# Patient Record
Sex: Male | Born: 1990 | Race: White | Hispanic: No | Marital: Married | Smoking: Former smoker
Health system: Southern US, Community
[De-identification: ages and names within clinical notes are randomized; demographics above are authoritative.]

## PROBLEM LIST (undated history)

## (undated) DIAGNOSIS — Z789 Other specified health status: Secondary | ICD-10-CM

## (undated) HISTORY — PX: WISDOM TOOTH EXTRACTION: SHX21

## (undated) HISTORY — DX: Other specified health status: Z78.9

## (undated) HISTORY — PX: TONSILLECTOMY: SUR1361

---

## 2013-03-22 ENCOUNTER — Emergency Department (INDEPENDENT_AMBULATORY_CARE_PROVIDER_SITE_OTHER): Payer: Managed Care, Other (non HMO)

## 2013-03-22 ENCOUNTER — Emergency Department
Admission: EM | Admit: 2013-03-22 | Discharge: 2013-03-22 | Disposition: A | Discharge status: Home / Self Care | Payer: Managed Care, Other (non HMO) | Source: Home / Self Care | Attending: Emergency Medicine | Admitting: Emergency Medicine

## 2013-03-22 ENCOUNTER — Encounter: Payer: Self-pay | Admitting: Emergency Medicine

## 2013-03-22 DIAGNOSIS — IMO0002 Reserved for concepts with insufficient information to code with codable children: Secondary | ICD-10-CM

## 2013-03-22 DIAGNOSIS — M25469 Effusion, unspecified knee: Secondary | ICD-10-CM

## 2013-03-22 DIAGNOSIS — S86912A Strain of unspecified muscle(s) and tendon(s) at lower leg level, left leg, initial encounter: Secondary | ICD-10-CM

## 2013-03-22 DIAGNOSIS — M25569 Pain in unspecified knee: Secondary | ICD-10-CM

## 2013-03-22 MED ORDER — MELOXICAM 7.5 MG PO TABS: 7.5000 mg | ORAL_TABLET | Freq: Every day | ORAL | Status: DC

## 2013-03-22 MED ORDER — HYDROCODONE-ACETAMINOPHEN 5-325 MG PO TABS: 1.0000 | ORAL_TABLET | Freq: Three times a day (TID) | ORAL | Status: DC | PRN

## 2013-03-22 NOTE — ED Provider Notes (Signed)
CSN: [id number]     Arrival date & time 03/22/13  1020 History   First MD Initiated Contact with Patient 03/22/13 1021     Chief Complaint  Patient presents with  . Knee Pain   (Consider location/radiation/quality/duration/timing/severity/associated sxs/prior Treatment) HPI Roger Fox reports shifting his weight to his left knee 2 nights ago, when he was on his honeymoon in [place], when it "gave out and dislocated" laterally. He popped it back in place after 3 minutes. 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 He reports that this started about 10 years ago after an injury, or an MRI showed some type of medial collateral ligament injury, but no surgery was done. Has tried ibuprofen which helps the pain a little bit. Yesterday, pain 8/10. Currently the pain is 4/10 at rest. Exacerbated by pressing on it or trying to weight-bear.--He describes the pain as sharp.  Associated symptoms: Denies numbness.   History reviewed. No pertinent past medical history. Past Surgical History  Procedure Laterality Date  . Tonsillectomy    . Wisdom tooth extraction     Family History  Problem Relation Age of Onset  . Diabetes Other   . Stroke Other    History  Substance Use Topics  . Smoking status: Never Smoker   . Smokeless tobacco: Not on file  . Alcohol Use: Yes    Review of Systems  All other systems reviewed and are negative.    Allergies  Sulfur  Home Medications   Current Outpatient Rx  Name  Route  Sig  Dispense  Refill  . HYDROcodone-acetaminophen (NORCO/VICODIN) 5-325 MG per tablet   Oral   Take 1-2 tablets by mouth every 8 (eight) hours as needed for pain. Take with food.--May cause drowsiness   10 tablet   0   . meloxicam (MOBIC) 7.5 MG tablet   Oral   Take 1 tablet (7.5 mg total) by mouth daily. For pain. If needed, may increase to 2 tablets by mouth daily for pain.   30 tablet   0    BP 132/82  Pulse 88  Resp 14  Ht 5\' 8"   (1.727 m)  Wt 185 lb (83.915 kg)  BMI 28.14 kg/m2  SpO2 96% Physical Exam  Nursing note and vitals reviewed. Constitutional: He is oriented to person, place, and time. He appears well-developed and well-nourished. No distress.  HENT:  Head: Normocephalic and atraumatic.  Eyes: Conjunctivae and EOM are normal. Pupils are equal, round, and reactive to light. No scleral icterus.  Neck: Normal range of motion.  Cardiovascular: Normal rate.   Pulmonary/Chest: Effort normal.  Abdominal: He exhibits no distension.  Musculoskeletal:       Left knee: He exhibits decreased range of motion, swelling, abnormal patellar mobility and bony tenderness. He exhibits no ecchymosis, no deformity and no laceration. Tenderness found. Medial joint line, lateral joint line, MCL and patellar tendon tenderness noted.  Exam difficult because of pain. No definite instability elicited.  Neurological: He is alert and oriented to person, place, and time. No sensory deficit.  Skin: Skin is warm.  Psychiatric: He has a normal mood and affect.    ED Course  Procedures (including critical care time) Labs Review Labs Reviewed - No data to display Imaging Review Dg Knee Complete 4 Views Left  03/22/2013   CLINICAL DATA:  Patellar dislocation on Saturday. Pain and swelling. History of recurrent dislocations.  EXAM: LEFT KNEE - COMPLETE 4+ VIEW  COMPARISON:  None.  FINDINGS: No acute fracture or dislocation. Suspect a small suprapatellar joint effusion.  IMPRESSION: Probable small suprapatellar joint effusion.   Electronically Signed   By: Jeronimo Greaves M.D.   On: 03/22/2013 11:36    EKG Interpretation     Ventricular Rate:    PR Interval:    QRS Duration:   QT Interval:    QTC Calculation:   R Axis:     Text Interpretation:              MDM   1. Knee strain, left, initial encounter    Reviewed x-rays. No acute fracture or dislocation. Possible small suprapatellar joint effusion. By history and exam,  it's likely that he had subluxation of patella laterally that is now back in place. Discussed this with patient and wife. Management options discussed at length. Questions invited and answered. Ice, elevate for first 48 hours after injury. Left knee splint/immobilizer provided. Use crutches which he already has. Meloxicam for moderate pain. Vicodin rx , acute prescription for severe acute pain. Precautions discussed. Advised followup with orthopedist within 2 days for further evaluation and treatment.--Patient's wife questioned whether or not we can order MRI knee at this time, and I advised that it would be best for orthopedic specialist to evaluate patient and then decide whether or not to order MRI.--We offered referral to orthopedist, but patient and wife declined her in making the orthopedic referral. Names and phone numbers of orthopedists given, and they'll call to see orthopedist within 2 days. Precautions discussed. Red flags discussed. Questions invited and answered. Patient voiced understanding and agreement.     Lajean Manes, MD 03/22/13 1210

## 2013-03-22 NOTE — ED Notes (Signed)
Armen reports shifting his weight to his left knee last night when it gave out and dislocated. He has a history of luxating patella.

## 2013-04-07 ENCOUNTER — Ambulatory Visit (INDEPENDENT_AMBULATORY_CARE_PROVIDER_SITE_OTHER): Payer: Managed Care, Other (non HMO) | Admitting: Sports Medicine

## 2013-04-07 ENCOUNTER — Encounter: Payer: Self-pay | Admitting: Sports Medicine

## 2013-04-07 VITALS — BP 139/84 | HR 91 | Wt 197.0 lb

## 2013-04-07 DIAGNOSIS — S83006A Unspecified dislocation of unspecified patella, initial encounter: Secondary | ICD-10-CM | POA: Insufficient documentation

## 2013-04-07 DIAGNOSIS — S83005A Unspecified dislocation of left patella, initial encounter: Secondary | ICD-10-CM

## 2013-04-07 NOTE — Progress Notes (Signed)
   Subjective:    I'm seeing this patient as a consultation for:  Dr. 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 More recently he was on his honeymoon, and dislocated his patella, it took in one hour to get back into place. He was seen by urgent care and referred to me for further evaluation and definitive treatment. Pain is localized over the medial aspect of the patella. Symptoms are moderate, persistent.  Past medical history, Surgical history, Family history not pertinant except as noted below, Social history, Allergies, and medications have been entered into the medical record, reviewed, and no changes needed.   Review of Systems: No headache, visual changes, nausea, vomiting, diarrhea, constipation, dizziness, abdominal pain, skin rash, fevers, chills, night sweats, weight loss, swollen lymph nodes, body aches, joint swelling, muscle aches, chest pain, shortness of breath, mood changes, visual or auditory hallucinations.   Objective:   General: Well Developed, well nourished, and in no acute distress.  Neuro/Psych: Alert and oriented x3, extra-ocular muscles intact, able to move all 4 extremities, sensation grossly intact. Skin: Warm and dry, no rashes noted.  Respiratory: Not using accessory muscles, speaking in full sentences, trachea midline.  Cardiovascular: Pulses palpable, no extremity edema. Abdomen: Does not appear distended. Left Knee: Normal to inspection with no erythema or effusion or obvious bony abnormalities. Tender to palpation over the medial patellar retinaculum. ROM full in flexion and extension and lower leg rotation. Ligaments with solid consistent endpoints including ACL, PCL, LCL, MCL. Negative Mcmurray's, Apley's, and Thessalonian tests. Positive patellar apprehension sign. Patellar and quadriceps tendons  unremarkable. Hamstring and quadriceps strength is normal.   X-rays were negative with the exception of trace effusion.  Impression and Recommendations:   This case required medical decision making of moderate complexity.

## 2013-04-07 NOTE — Assessment & Plan Note (Signed)
Recurrent, exam is fairly benign with only a positive patellar apprehension sign. At this point with his poor vastus medialis definition I do think we need to pursue more aggressive physical therapy. I would also like him to have a patellar stabilizing brace. I like to see him back monthly for approximately 2-3 months before considering having him see the surgeon for consideration of lateral release and medial patellar plication. Certainly in the future he could be a candidate for the Fulkerson slide if all of the above fail. Continue NSAIDs as needed.

## 2013-04-13 ENCOUNTER — Ambulatory Visit (INDEPENDENT_AMBULATORY_CARE_PROVIDER_SITE_OTHER): Payer: Managed Care, Other (non HMO) | Admitting: Physical Therapy

## 2013-04-13 DIAGNOSIS — R609 Edema, unspecified: Secondary | ICD-10-CM

## 2013-04-13 DIAGNOSIS — M6281 Muscle weakness (generalized): Secondary | ICD-10-CM

## 2013-04-13 DIAGNOSIS — R269 Unspecified abnormalities of gait and mobility: Secondary | ICD-10-CM

## 2013-04-13 DIAGNOSIS — S83006A Unspecified dislocation of unspecified patella, initial encounter: Secondary | ICD-10-CM

## 2013-04-20 ENCOUNTER — Encounter: Payer: Managed Care, Other (non HMO) | Admitting: Physical Therapy

## 2013-04-20 DIAGNOSIS — M6281 Muscle weakness (generalized): Secondary | ICD-10-CM

## 2013-04-20 DIAGNOSIS — R609 Edema, unspecified: Secondary | ICD-10-CM

## 2013-04-20 DIAGNOSIS — M25669 Stiffness of unspecified knee, not elsewhere classified: Secondary | ICD-10-CM

## 2013-04-20 DIAGNOSIS — S83006A Unspecified dislocation of unspecified patella, initial encounter: Secondary | ICD-10-CM

## 2013-04-20 DIAGNOSIS — R269 Unspecified abnormalities of gait and mobility: Secondary | ICD-10-CM

## 2013-04-30 ENCOUNTER — Encounter: Payer: Managed Care, Other (non HMO) | Admitting: Physical Therapy

## 2013-04-30 DIAGNOSIS — M25669 Stiffness of unspecified knee, not elsewhere classified: Secondary | ICD-10-CM

## 2013-04-30 DIAGNOSIS — M6281 Muscle weakness (generalized): Secondary | ICD-10-CM

## 2013-04-30 DIAGNOSIS — R609 Edema, unspecified: Secondary | ICD-10-CM

## 2013-04-30 DIAGNOSIS — R269 Unspecified abnormalities of gait and mobility: Secondary | ICD-10-CM

## 2013-04-30 DIAGNOSIS — S83006A Unspecified dislocation of unspecified patella, initial encounter: Secondary | ICD-10-CM

## 2013-05-05 ENCOUNTER — Ambulatory Visit (INDEPENDENT_AMBULATORY_CARE_PROVIDER_SITE_OTHER): Payer: Managed Care, Other (non HMO) | Admitting: Sports Medicine

## 2013-05-05 ENCOUNTER — Encounter: Payer: Self-pay | Admitting: Sports Medicine

## 2013-05-05 VITALS — BP 135/81 | HR 78 | Wt 197.0 lb

## 2013-05-05 DIAGNOSIS — S83006A Unspecified dislocation of unspecified patella, initial encounter: Secondary | ICD-10-CM

## 2013-05-05 DIAGNOSIS — S83005A Unspecified dislocation of left patella, initial encounter: Secondary | ICD-10-CM

## 2013-05-05 NOTE — Assessment & Plan Note (Signed)
Strength and pain continues to improve after patellar dislocation. Continue aggressive vastus medialis rehabilitation as well as hip abductor rehabilitation. Continue on day per week and physical therapy, and daily exercises at home. Continue Mobic on an as needed basis, continue patellar stabilizing brace. Return to see me in 2 months. I do think he will proceed well, and will not need surgical intervention.

## 2013-05-05 NOTE — Patient Instructions (Signed)
Exercise prescription:  You should adjust the intensity of your exercise based on your heart rate. The [employer] sports medicine recommends keeping your heart rate between 70-80% of its maximum for 30 minutes, 3-5 times per week. Maximum heart rate = (220 - age). Multiply this number by 0.75 to get your goal heart rate. If lower, then increase the intensity of your exercise. If the number is higher, you may decrease the intensity of your exercise. 

## 2013-05-05 NOTE — Progress Notes (Signed)
  Subjective:    CC: Followup  HPI: Left patellar dislocation: Patient continues to improve and makes great strides with formal physical therapy and vastus medialis rehabilitation. He also is doing very well in the patellar stabilizing brace. Pain is significantly decreased, he is able to walk and go up and down stairs without pain.  Past medical history, Surgical history, Family history not pertinant except as noted below, Social history, Allergies, and medications have been entered into the medical record, reviewed, and no changes needed.   Review of Systems: No fevers, chills, night sweats, weight loss, chest pain, or shortness of breath.   Objective:    General: Well Developed, well nourished, and in no acute distress.  Neuro: Alert and oriented x3, extra-ocular muscles intact, sensation grossly intact.  HEENT: Normocephalic, atraumatic, pupils equal round reactive to light, neck supple, no masses, no lymphadenopathy, thyroid nonpalpable.  Skin: Warm and dry, no rashes. Cardiac: Regular rate and rhythm, no murmurs rubs or gallops, no lower extremity edema.  Respiratory: Clear to auscultation bilaterally. Not using accessory muscles, speaking in full sentences. Left Knee: Only minimally swollen, no areas of tenderness to palpation, he now has a negative patellar apprehension sign. ROM full in flexion and extension and lower leg rotation. Ligaments with solid consistent endpoints including ACL, PCL, LCL, MCL. Negative Mcmurray's, Apley's, and Thessalonian tests. Non painful patellar compression. Patellar glide without crepitus. Patellar and quadriceps tendons unremarkable. Hamstring and quadriceps strength is normal.   Impression and Recommendations:

## 2013-05-11 ENCOUNTER — Encounter (INDEPENDENT_AMBULATORY_CARE_PROVIDER_SITE_OTHER): Payer: Managed Care, Other (non HMO) | Admitting: Physical Therapy

## 2013-05-11 DIAGNOSIS — M6281 Muscle weakness (generalized): Secondary | ICD-10-CM

## 2013-05-11 DIAGNOSIS — S83006A Unspecified dislocation of unspecified patella, initial encounter: Secondary | ICD-10-CM

## 2013-05-11 DIAGNOSIS — R279 Unspecified lack of coordination: Secondary | ICD-10-CM

## 2013-05-11 DIAGNOSIS — R609 Edema, unspecified: Secondary | ICD-10-CM

## 2013-05-17 ENCOUNTER — Encounter (INDEPENDENT_AMBULATORY_CARE_PROVIDER_SITE_OTHER): Payer: Managed Care, Other (non HMO) | Admitting: Physical Therapy

## 2013-05-17 DIAGNOSIS — S83006A Unspecified dislocation of unspecified patella, initial encounter: Secondary | ICD-10-CM

## 2013-05-17 DIAGNOSIS — R609 Edema, unspecified: Secondary | ICD-10-CM

## 2013-05-17 DIAGNOSIS — M25669 Stiffness of unspecified knee, not elsewhere classified: Secondary | ICD-10-CM

## 2013-05-17 DIAGNOSIS — M6281 Muscle weakness (generalized): Secondary | ICD-10-CM

## 2013-05-17 DIAGNOSIS — R279 Unspecified lack of coordination: Secondary | ICD-10-CM

## 2013-05-24 ENCOUNTER — Encounter (INDEPENDENT_AMBULATORY_CARE_PROVIDER_SITE_OTHER): Payer: Managed Care, Other (non HMO) | Admitting: Physical Therapy

## 2013-05-24 ENCOUNTER — Encounter (INDEPENDENT_AMBULATORY_CARE_PROVIDER_SITE_OTHER): Payer: Self-pay

## 2013-05-24 DIAGNOSIS — M25669 Stiffness of unspecified knee, not elsewhere classified: Secondary | ICD-10-CM

## 2013-05-24 DIAGNOSIS — R609 Edema, unspecified: Secondary | ICD-10-CM

## 2013-05-24 DIAGNOSIS — M6281 Muscle weakness (generalized): Secondary | ICD-10-CM

## 2013-05-24 DIAGNOSIS — R269 Unspecified abnormalities of gait and mobility: Secondary | ICD-10-CM

## 2013-05-24 DIAGNOSIS — S83006A Unspecified dislocation of unspecified patella, initial encounter: Secondary | ICD-10-CM

## 2014-07-26 ENCOUNTER — Encounter: Payer: Self-pay | Admitting: *Deleted

## 2014-07-26 ENCOUNTER — Emergency Department (INDEPENDENT_AMBULATORY_CARE_PROVIDER_SITE_OTHER)
Admission: EM | Admit: 2014-07-26 | Discharge: 2014-07-26 | Disposition: A | Discharge status: Home / Self Care | Payer: BLUE CROSS/BLUE SHIELD | Source: Home / Self Care | Attending: Emergency Medicine | Admitting: Emergency Medicine

## 2014-07-26 DIAGNOSIS — J029 Acute pharyngitis, unspecified: Secondary | ICD-10-CM

## 2014-07-26 DIAGNOSIS — B349 Viral infection, unspecified: Secondary | ICD-10-CM

## 2014-07-26 LAB — POCT RAPID STREP A (OFFICE): Rapid Strep A Screen: NEGATIVE

## 2014-07-26 MED ORDER — HYDROCOD POLST-CHLORPHEN POLST 10-8 MG/5ML PO LQCR: 5.0000 mL | Freq: Two times a day (BID) | ORAL | Status: DC

## 2014-07-26 MED ORDER — IBUPROFEN 800 MG PO TABS: 800.0000 mg | ORAL_TABLET | Freq: Three times a day (TID) | ORAL | Status: DC

## 2014-07-26 NOTE — Discharge Instructions (Signed)
Cough, Adult ° A cough is a reflex that helps clear your throat and airways. It can help heal the body or may be a reaction to an irritated airway. A cough may only last 2 or 3 weeks (acute) or may last more than 8 weeks (chronic).  °CAUSES °Acute cough: °· Viral or bacterial infections. °Chronic cough: °· Infections. °· Allergies. °· Asthma. °· Post-nasal drip. °· Smoking. °· Heartburn or acid reflux. °· Some medicines. °· Chronic lung problems (COPD). °· Cancer. °SYMPTOMS  °· Cough. °· Fever. °· Chest pain. °· Increased breathing rate. °· High-pitched whistling sound when breathing (wheezing). °· Colored mucus that you cough up (sputum). °TREATMENT  °· A bacterial cough may be treated with antibiotic medicine. °· A viral cough must run its course and will not respond to antibiotics. °· Your caregiver may recommend other treatments if you have a chronic cough. °HOME CARE INSTRUCTIONS  °· Only take over-the-counter or prescription medicines for pain, discomfort, or fever as directed by your caregiver. Use cough suppressants only as directed by your caregiver. °· Use a cold steam vaporizer or humidifier in your bedroom or home to help loosen secretions. °· Sleep in a semi-upright position if your cough is worse at night. °· Rest as needed. °· Stop smoking if you smoke. °SEEK IMMEDIATE MEDICAL CARE IF:  °· You have pus in your sputum. °· Your cough starts to worsen. °· You cannot control your cough with suppressants and are losing sleep. °· You begin coughing up blood. °· You have difficulty breathing. °· You develop pain which is getting worse or is uncontrolled with medicine. °· You have a fever. °MAKE SURE YOU:  °· Understand these instructions. °· Will watch your condition. °· Will get help right away if you are not doing well or get worse. °Document Released: 11/09/2010 Document Revised: 08/05/2011 Document Reviewed: 11/09/2010 °[employer]® Patient Information ©2015 [employer]. This information is not intended  to replace advice given to you by your health care provider. Make sure you discuss any questions you have with your health care provider. °Viral Infections °A viral infection can be caused by different types of viruses. Most viral infections are not serious and resolve on their own. However, some infections may cause severe symptoms and may lead to further complications. °SYMPTOMS °Viruses can frequently cause: °· Minor sore throat. °· Aches and pains. °· Headaches. °· Runny nose. °· Different types of rashes. °· Watery eyes. °· Tiredness. °· Cough. °· Loss of appetite. °· Gastrointestinal infections, resulting in nausea, vomiting, and diarrhea. °These symptoms do not respond to antibiotics because the infection is not caused by bacteria. However, you might catch a bacterial infection following the viral infection. This is sometimes called a "superinfection." Symptoms of such a bacterial infection may include: °· Worsening sore throat with pus and difficulty swallowing. °· Swollen neck glands. °· Chills and a high or persistent fever. °· Severe headache. °· Tenderness over the sinuses. °· Persistent overall ill feeling (malaise), muscle aches, and tiredness (fatigue). °· Persistent cough. °· Yellow, green, or brown mucus production with coughing. °HOME CARE INSTRUCTIONS  °· Only take over-the-counter or prescription medicines for pain, discomfort, diarrhea, or fever as directed by your caregiver. °· Drink enough water and fluids to keep your urine clear or pale yellow. Sports drinks can provide valuable electrolytes, sugars, and hydration. °· Get plenty of rest and maintain proper nutrition. Soups and broths with crackers or rice are fine. °SEEK IMMEDIATE MEDICAL CARE IF:  °· You have severe headaches,   shortness of breath, chest pain, neck pain, or an unusual rash. °· You have uncontrolled vomiting, diarrhea, or you are unable to keep down fluids. °· You or your child has an oral temperature above 102° F (38.9° C),  not controlled by medicine. °· Your baby is older than 3 months with a rectal temperature of 102° F (38.9° C) or higher. °· Your baby is 3 months old or younger with a rectal temperature of 100.4° F (38° C) or higher. °MAKE SURE YOU:  °· Understand these instructions. °· Will watch your condition. °· Will get help right away if you are not doing well or get worse. °Document Released: 02/20/2005 Document Revised: 08/05/2011 Document Reviewed: 09/17/2010 °ExitCare® Patient Information ©2015 [employer]. This information is not intended to replace advice given to you by your health care provider. Make sure you discuss any questions you have with your health care provider. ° °

## 2014-07-26 NOTE — ED Provider Notes (Signed)
CSN: [id number]     Arrival date & time 07/26/14  1227 History   First MD Initiated Contact with Patient 07/26/14 1335     Chief Complaint  Patient presents with  . Sore Throat  . Cough   (Consider location/radiation/quality/duration/timing/severity/associated sxs/prior Treatment) Patient is a 24 y.o. male presenting with cough. The history is provided by the patient. No language interpreter was used.  Cough Cough characteristics:  Productive Sputum characteristics:  Nondescript Severity:  Moderate Onset quality:  Gradual Duration:  4 days Timing:  Constant Progression:  Worsening Chronicity:  New Smoker: no   Context: upper respiratory infection   Relieved by:  Nothing Worsened by:  Nothing tried Ineffective treatments:  None tried Associated symptoms: fever, headaches, rhinorrhea and sore throat     History reviewed. No pertinent past medical history. Past Surgical History  Procedure Laterality Date  . Tonsillectomy    . Wisdom tooth extraction     Family History  Problem Relation Age of Onset  . Diabetes Other   . Stroke Other    History  Substance Use Topics  . Smoking status: Former Games developermoker  . Smokeless tobacco: Not on file  . Alcohol Use: Yes    Review of Systems  Constitutional: Positive for fever.  HENT: Positive for rhinorrhea and sore throat.   Respiratory: Positive for cough.   Neurological: Positive for headaches.  All other systems reviewed and are negative.   Allergies  Sulfur  Home Medications   Prior to Admission medications   Not on File   BP 132/83 mmHg  Pulse 64  Temp(Src) 98.2 F (36.8 C) (Oral)  Resp 16  Ht 5\' 8"  (1.727 m)  Wt 200 lb (90.719 kg)  BMI 30.42 kg/m2  SpO2 99% Physical Exam  Constitutional: He is oriented to person, place, and time. He appears well-developed and well-nourished.  HENT:  Head: Normocephalic and atraumatic.  Eyes: Conjunctivae and EOM are normal. Pupils are equal, round, and reactive to light.   Neck: Normal range of motion.  Cardiovascular: Normal rate and normal heart sounds.   Pulmonary/Chest: Effort normal.  Abdominal: Soft. He exhibits no distension.  Musculoskeletal: Normal range of motion.  Neurological: He is alert and oriented to person, place, and time.  Skin: Skin is warm.  Psychiatric: He has a normal mood and affect.  Nursing note and vitals reviewed.   ED Course  Procedures (including critical care time) Labs Review Labs Reviewed  POCT RAPID STREP A (OFFICE)    Imaging Review No results found. Strep negative   MDM I suspect viral illness   1. Viral illness    Ibuprofen tussionex Return if any problems.    Lonia SkinnerLeslie K Mountain DaleSofia, PA-C 07/26/14 [phone]

## 2014-07-26 NOTE — ED Notes (Signed)
Pt c/o cough, nasal congestion, and sore throat x 4 days. No fever.

## 2017-11-18 DIAGNOSIS — Z91018 Allergy to other foods: Secondary | ICD-10-CM | POA: Diagnosis not present

## 2017-11-18 DIAGNOSIS — J3089 Other allergic rhinitis: Secondary | ICD-10-CM | POA: Diagnosis not present

## 2017-11-18 DIAGNOSIS — J301 Allergic rhinitis due to pollen: Secondary | ICD-10-CM | POA: Diagnosis not present

## 2017-11-19 DIAGNOSIS — Z91018 Allergy to other foods: Secondary | ICD-10-CM | POA: Diagnosis not present

## 2018-03-04 DIAGNOSIS — R05 Cough: Secondary | ICD-10-CM | POA: Diagnosis not present

## 2018-03-04 DIAGNOSIS — J301 Allergic rhinitis due to pollen: Secondary | ICD-10-CM | POA: Diagnosis not present

## 2018-03-04 DIAGNOSIS — Z91018 Allergy to other foods: Secondary | ICD-10-CM | POA: Diagnosis not present

## 2018-03-04 DIAGNOSIS — J3089 Other allergic rhinitis: Secondary | ICD-10-CM | POA: Diagnosis not present

## 2018-04-03 DIAGNOSIS — Z23 Encounter for immunization: Secondary | ICD-10-CM | POA: Diagnosis not present

## 2018-07-08 DIAGNOSIS — K600 Acute anal fissure: Secondary | ICD-10-CM | POA: Diagnosis not present

## 2018-10-16 DIAGNOSIS — B356 Tinea cruris: Secondary | ICD-10-CM | POA: Diagnosis not present

## 2018-11-12 DIAGNOSIS — B356 Tinea cruris: Secondary | ICD-10-CM | POA: Diagnosis not present

## 2018-11-12 DIAGNOSIS — Z0189 Encounter for other specified special examinations: Secondary | ICD-10-CM | POA: Diagnosis not present

## 2018-11-24 ENCOUNTER — Emergency Department (INDEPENDENT_AMBULATORY_CARE_PROVIDER_SITE_OTHER): Payer: BC Managed Care – PPO

## 2018-11-24 ENCOUNTER — Other Ambulatory Visit: Payer: Self-pay

## 2018-11-24 ENCOUNTER — Emergency Department (INDEPENDENT_AMBULATORY_CARE_PROVIDER_SITE_OTHER)
Admission: EM | Admit: 2018-11-24 | Discharge: 2018-11-24 | Disposition: A | Discharge status: Home / Self Care | Payer: BC Managed Care – PPO | Source: Home / Self Care | Attending: Family Medicine | Admitting: Family Medicine

## 2018-11-24 DIAGNOSIS — M25561 Pain in right knee: Secondary | ICD-10-CM | POA: Diagnosis not present

## 2018-11-24 DIAGNOSIS — B356 Tinea cruris: Secondary | ICD-10-CM

## 2018-11-24 DIAGNOSIS — S83001A Unspecified subluxation of right patella, initial encounter: Secondary | ICD-10-CM

## 2018-11-24 MED ORDER — IBUPROFEN 600 MG PO TABS
600.0000 mg | ORAL_TABLET | Freq: Once | ORAL | Status: AC
  Administered 2018-11-24: 15:00:00 600 mg via ORAL

## 2018-11-24 MED ORDER — TRIAMCINOLONE ACETONIDE 0.1 % EX CREA: 1.0000 "application " | TOPICAL_CREAM | Freq: Two times a day (BID) | CUTANEOUS | 0 refills | Status: DC

## 2018-11-24 MED ORDER — FLUCONAZOLE 150 MG PO TABS: ORAL_TABLET | ORAL | 0 refills | Status: DC

## 2018-11-24 MED ORDER — HYDROCODONE-ACETAMINOPHEN 5-325 MG PO TABS: 1.0000 | ORAL_TABLET | Freq: Four times a day (QID) | ORAL | 0 refills | Status: DC | PRN

## 2018-11-24 NOTE — Discharge Instructions (Addendum)
Use crutches (no weight bearing) until follow-up visit with Sports Medicine Physician. Apply ice pack to knee for 20 to 30 minutes, 3 to 4 times daily  Continue until pain and swelling decrease.  Take Ibuprofen 200mg , 4 tabs every 8 hours with food for 3 to 4 days.  Wear patellar stabilization brace.

## 2018-11-24 NOTE — ED Provider Notes (Signed)
[name]    CSN: [id number] Arrival date & time: 11/24/18  1442     History   Chief Complaint Chief Complaint  Patient presents with  . Knee Pain    Injury, RT knee    HPI Roger Fox is a 28 y.o. male.   Patient presents with two complaints: 1)  While cleaning the family swimming pool about an hour ago, patient's 30 year old son fell in the water.  The patient then jumped in to rescue him, and felt immediate pain in his right knee.  He has had multiple subluxations of his left knee in the past, and he knew that subluxation of his right patella had occurred.  The subluxation spontaneously reduced, and patient has had persistent pain in his right anterior/lateral knee. 2)  Patient complains of a pruritic bilateral inguinal rash present for about two months.  He has tried Lotrimin cream with partial improvement.  The history is provided by the patient.  Knee Pain Location:  Knee Time since incident:  1 hour Injury: yes   Knee location:  R knee Pain details:    Quality:  Aching   Radiates to:  Does not radiate   Severity:  Severe   Onset quality:  Sudden   Duration:  1 hour   Timing:  Constant   Progression:  Unchanged Chronicity:  New Dislocation: yes   Prior injury to area:  No Relieved by:  Nothing Worsened by:  Bearing weight, extension and flexion Ineffective treatments:  Ice Associated symptoms: decreased ROM, stiffness and swelling   Associated symptoms: no muscle weakness, no numbness and no tingling     History reviewed. Past medical history of recurrent left patellar subluxation.  Patient Active Problem List   Diagnosis Date Noted  . Patellar dislocation 04/07/2013    Past Surgical History:  Procedure Laterality Date  . TONSILLECTOMY    . WISDOM TOOTH EXTRACTION         Home Medications    Prior to Admission medications   Medication Sig Start Date End Date Taking? Authorizing Provider  cetirizine (ZYRTEC) 10 MG tablet Take 10  mg by mouth daily.   Yes [provider]  chlorpheniramine-HYDROcodone (TUSSIONEX PENNKINETIC ER) 10-8 MG/5ML LQCR Take 5 mLs by mouth 2 (two) times daily. 07/26/14   Elson AreasSofia, Leslie K, PA-C  fluconazole (DIFLUCAN) 150 MG tablet Take one tab PO once daily for 3 days 11/24/18   Lattie HawBeese,  A, MD  HYDROcodone-acetaminophen (NORCO/VICODIN) 5-325 MG tablet Take 1 tablet by mouth every 6 (six) hours as needed for moderate pain or severe pain. 11/24/18   Lattie HawBeese,  A, MD  ibuprofen (ADVIL,MOTRIN) 800 MG tablet Take 1 tablet (800 mg total) by mouth 3 (three) times daily. 07/26/14   Elson AreasSofia, Leslie K, PA-C  triamcinolone cream (KENALOG) 0.1 % Apply 1 application topically 2 (two) times daily. 11/24/18   Lattie HawBeese,  A, MD    Family History Family History  Problem Relation Age of Onset  . Diabetes Other   . Stroke Other     Social History Social History   Tobacco Use  . Smoking status: Former Smoker  Substance Use Topics  . Alcohol use: Yes  . Drug use: No     Allergies   Sulfur   Review of Systems Review of Systems  Musculoskeletal: Positive for joint swelling and stiffness.       Right knee pain and patellar subluxation, spontaneously reduced.  Skin: Positive for rash.  All other systems reviewed  and are negative.    Physical Exam Triage Vital Signs ED Triage Vitals [11/24/18 1500]  Enc Vitals Group     BP 130/79     Pulse Rate 77     Resp 18     Temp 98.6 F (37 C)     Temp Source Oral     SpO2 98 %     Weight 192 lb (87.1 kg)     Height 5\' 8"  (1.727 m)     Head Circumference      Peak Flow      Pain Score 8     Pain Loc      Pain Edu?      Excl. in [place]?    No data found.  Updated Vital Signs BP 130/79 (BP Location: Right Arm)   Pulse 77   Temp 98.6 F (37 C) (Oral)   Resp 18   Ht 5\' 8"  (1.727 m)   Wt 87.1 kg   SpO2 98%   BMI 29.19 kg/m   Visual Acuity Right Eye Distance:   Left Eye Distance:   Bilateral Distance:    Right Eye Near:    Left Eye Near:    Bilateral Near:     Physical Exam Vitals signs and nursing note reviewed.  Constitutional:      General: He is not in acute distress. HENT:     Head: Normocephalic.     Nose: Nose normal.  Eyes:     Pupils: Pupils are equal, round, and reactive to light.  Neck:     Musculoskeletal: Normal range of motion.  Cardiovascular:     Rate and Rhythm: Normal rate.  Pulmonary:     Effort: Pulmonary effort is normal.  Genitourinary:      Comments: Mildly erythematous, slightly excoriated areas in bilateral inguinal regions as noted on diagram. No tenderness to palpation. Areas do not fluoresce by Wood's lamp. Musculoskeletal:     Right knee: He exhibits decreased range of motion and abnormal patellar mobility. He exhibits no swelling, no ecchymosis, no deformity, no laceration, no erythema, normal alignment and no LCL laxity. Tenderness found.     Comments: Right knee has limited range of motion and tenderness to palpation over patella.  Difficult to fully examine because of patient's guarding.  Skin:    General: Skin is warm and dry.  Neurological:     Mental Status: He is alert.      UC Treatments / Results  Labs (all labs ordered are listed, but only abnormal results are displayed) Labs Reviewed - No data to display  EKG   Radiology Dg Knee Complete 4 Views Right  Result Date: 11/24/2018 CLINICAL DATA:  28 year old male. Right knee pain after were injury. EXAM: RIGHT KNEE - COMPLETE 4+ VIEW COMPARISON:  None. FINDINGS: No evidence of fracture, dislocation, or joint effusion. No evidence of arthropathy or other focal bone abnormality. Soft tissues are unremarkable. IMPRESSION: Negative. Electronically Signed   By: Dorise Bullion III M.D   On: 11/24/2018 16:49    Procedures Procedures (including critical care time)  Medications Ordered in UC Medications  ibuprofen (ADVIL) tablet 600 mg (600 mg Oral Given 11/24/18 1502)    Initial Impression /  Assessment and Plan / UC Course  I have reviewed the triage vital signs and the nursing notes.  Pertinent labs & imaging results that were available during my care of the patient were reviewed by me and considered in my medical decision making (see chart for details).  Patellar stabilization brace applied.  Patient reports that he has crutches at home. Rx for Lortab (#15, no refill). Controlled Substance Prescriptions I have consulted the  Controlled Substances Registry for this patient, and feel the risk/benefit ratio today is favorable for proceeding with this prescription for a controlled substance.   Rx for Diflucan 150mg  QD for 3 days.  Rx for triamcinolone cream BID. Followup with Dr. Rodney Langtonhomas Thekkekandam for management of patellar subluxation. Followup with dermatologist if inguinal rash not resolved about 10 days.   Final Clinical Impressions(s) / UC Diagnoses   Final diagnoses:  Patellar subluxation, right, initial encounter  Tinea cruris     Discharge Instructions     Use crutches (no weight bearing) until follow-up visit with Sports Medicine Physician. Apply ice pack to knee for 20 to 30 minutes, 3 to 4 times daily  Continue until pain and swelling decrease.  Take Ibuprofen 200mg , 4 tabs every 8 hours with food for 3 to 4 days.  Wear patellar stabilization brace.    ED Prescriptions    Medication Sig Dispense Auth. Provider   HYDROcodone-acetaminophen (NORCO/VICODIN) 5-325 MG tablet Take 1 tablet by mouth every 6 (six) hours as needed for moderate pain or severe pain. 15 tablet Lattie HawBeese,  A, MD   fluconazole (DIFLUCAN) 150 MG tablet Take one tab PO once daily for 3 days 3 tablet Lattie HawBeese,  A, MD   triamcinolone cream (KENALOG) 0.1 % Apply 1 application topically 2 (two) times daily. 28.4 g Lattie HawBeese,  A, MD        Lattie HawBeese,  A, MD 11/25/18 [phone]

## 2018-11-24 NOTE — ED Notes (Signed)
Pt given an additional 200mg  of ibuprofen per Dr Assunta Found to make for 800mg  total

## 2018-11-24 NOTE — ED Triage Notes (Signed)
Pt c/o RT knee pain after dislocating it at family pool while trying to save son who fell into pool. EMS checked out son and pts knee who advised him to come here to have possible xray to r/o any fx. Pain 8/10.

## 2018-11-30 ENCOUNTER — Encounter: Payer: Self-pay | Admitting: Sports Medicine

## 2018-11-30 ENCOUNTER — Other Ambulatory Visit: Payer: Self-pay

## 2018-11-30 ENCOUNTER — Ambulatory Visit (INDEPENDENT_AMBULATORY_CARE_PROVIDER_SITE_OTHER): Payer: BC Managed Care – PPO | Admitting: Sports Medicine

## 2018-11-30 ENCOUNTER — Ambulatory Visit (INDEPENDENT_AMBULATORY_CARE_PROVIDER_SITE_OTHER): Payer: BC Managed Care – PPO

## 2018-11-30 DIAGNOSIS — S83004D Unspecified dislocation of right patella, subsequent encounter: Secondary | ICD-10-CM

## 2018-11-30 DIAGNOSIS — S8991XA Unspecified injury of right lower leg, initial encounter: Secondary | ICD-10-CM | POA: Diagnosis not present

## 2018-11-30 MED ORDER — HYDROCODONE-ACETAMINOPHEN 5-325 MG PO TABS: 1.0000 | ORAL_TABLET | Freq: Three times a day (TID) | ORAL | 0 refills | Status: DC | PRN

## 2018-11-30 NOTE — Assessment & Plan Note (Addendum)
History of a left-sided dislocation back in 2014. Recently had a traumatic dislocation of his right patella several days ago. Knee immobilizer, x-rays were unremarkable, he does need an MRI. I would like him to start formal physical therapy in about 2 weeks. Hydrocodone for pain.  There are no tears of the meniscus, no tears of the medial patellofemoral ligament, there is bone bruising under the femur and the patella all consistent with a transient patellar dislocation, there is nothing immediately surgical visible.  This means that we can likely rehab the knee, and in the absence of similar trauma he should not expect an additional dislocation when fully rehabbed.

## 2018-11-30 NOTE — Progress Notes (Addendum)
Subjective:    CC: Right knee injury  HPI:  Last week this 28 year old male was working on his swimming pool, his son fell in the pool so he jumped in to catch his son, he landed on his right knee, it was forced into flexion and valgus.  He had immediate pain, inability to use the leg and visible deformity.  He strained out his knee and reduced his dislocated patella himself, this is happened before on the left.  He was seen in urgent care where x-rays were negative, he has severe pain, swelling, localized medial to the patella and at the medial joint line.  I reviewed the past medical history, family history, social history, surgical history, and allergies today and no changes were needed.  Please see the problem list section below in epic for further details.  Past Medical History: Past Medical History:  Diagnosis Date  . No pertinent past medical history    Past Surgical History: Past Surgical History:  Procedure Laterality Date  . TONSILLECTOMY    . WISDOM TOOTH EXTRACTION     Social History: Social History   Socioeconomic History  . Marital status: Married    Spouse name: Not on file  . Number of children: Not on file  . Years of education: Not on file  . Highest education level: Not on file  Occupational History  . Not on file  Social Needs  . Financial resource strain: Not on file  . Food insecurity    Worry: Not on file    Inability: Not on file  . Transportation needs    Medical: Not on file    Non-medical: Not on file  Tobacco Use  . Smoking status: Former Research scientist (life sciences)  . Smokeless tobacco: Never Used  Substance and Sexual Activity  . Alcohol use: Yes  . Drug use: No  . Sexual activity: Not on file  Lifestyle  . Physical activity    Days per week: Not on file    Minutes per session: Not on file  . Stress: Not on file  Relationships  . Social Herbalist on phone: Not on file    Gets together: Not on file    Attends religious service: Not on file     Active member of club or organization: Not on file    Attends meetings of clubs or organizations: Not on file    Relationship status: Not on file  Other Topics Concern  . Not on file  Social History Narrative  . Not on file   Family History: Family History  Problem Relation Age of Onset  . Diabetes Other   . Stroke Other   . Cancer Maternal Grandfather        colon   Allergies: Allergies  Allergen Reactions  . Sulfur    Medications: See med rec.  Review of Systems: No headache, visual changes, nausea, vomiting, diarrhea, constipation, dizziness, abdominal pain, skin rash, fevers, chills, night sweats, swollen lymph nodes, weight loss, chest pain, body aches, joint swelling, muscle aches, shortness of breath, mood changes, visual or auditory hallucinations.  Objective:    General: Well Developed, well nourished, and in no acute distress.  Neuro: Alert and oriented x3, extra-ocular muscles intact, sensation grossly intact.  HEENT: Normocephalic, atraumatic, pupils equal round reactive to light, neck supple, no masses, no lymphadenopathy, thyroid nonpalpable.  Skin: Warm and dry, no rashes noted.  Cardiac: Regular rate and rhythm, no murmurs rubs or gallops.  Respiratory: Clear to auscultation  bilaterally. Not using accessory muscles, speaking in full sentences.  Abdominal: Soft, nontender, nondistended, positive bowel sounds, no masses, no organomegaly.  Right knee: Visibly swollen, palpable fluid wave with an effusion, positive patellar apprehension sign.  Tenderness at the medial joint line.  Impression and Recommendations:    The patient was counselled, risk factors were discussed, anticipatory guidance given.  Patellar dislocation History of a left-sided dislocation back in 2014. Recently had a traumatic dislocation of his right patella several days ago. Knee immobilizer, x-rays were unremarkable, he does need an MRI. I would like him to start formal physical therapy  in about 2 weeks. Hydrocodone for pain.  There are no tears of the meniscus, no tears of the medial patellofemoral ligament, there is bone bruising under the femur and the patella all consistent with a transient patellar dislocation, there is nothing immediately surgical visible.  This means that we can likely rehab the knee, and in the absence of similar trauma he should not expect an additional dislocation when fully rehabbed.   ___________________________________________ Ihor Austinhomas J. Benjamin Stainhekkekandam, M.D., ABFM., CAQSM. Primary Care and Sports Medicine [facility]  Adjunct Professor of Family Medicine  [employer]

## 2018-12-09 ENCOUNTER — Ambulatory Visit: Payer: BC Managed Care – PPO | Admitting: Sports Medicine

## 2018-12-14 ENCOUNTER — Ambulatory Visit (INDEPENDENT_AMBULATORY_CARE_PROVIDER_SITE_OTHER): Payer: BC Managed Care – PPO | Admitting: Rehabilitative and Restorative Service Providers"

## 2018-12-14 ENCOUNTER — Encounter: Payer: Self-pay | Admitting: Rehabilitative and Restorative Service Providers"

## 2018-12-14 ENCOUNTER — Other Ambulatory Visit: Payer: Self-pay

## 2018-12-14 DIAGNOSIS — M6281 Muscle weakness (generalized): Secondary | ICD-10-CM | POA: Diagnosis not present

## 2018-12-14 DIAGNOSIS — M25561 Pain in right knee: Secondary | ICD-10-CM

## 2018-12-14 DIAGNOSIS — M25661 Stiffness of right knee, not elsewhere classified: Secondary | ICD-10-CM

## 2018-12-14 DIAGNOSIS — R2689 Other abnormalities of gait and mobility: Secondary | ICD-10-CM | POA: Diagnosis not present

## 2018-12-14 NOTE — Patient Instructions (Signed)
Access Code: [email or web address]  URL: [email or web address]  Date: 12/14/2018  Prepared by: Gillermo Murdoch   Exercises  Supine Ankle Pumps - 10-15 reps - 1 sets - 2x daily - 7x weekly  Ankle Circles in Elevation - 10-15 reps - 1 sets - 2x daily - 7x weekly  Ankle Alphabet in Elevation - 1 reps - 1 sets - 2x daily - 7x weekly  Supine Hamstring Stretch with Strap - 3 reps - 1 sets - 30 seconds hold - 2x daily - 7x weekly  Seated Knee Flexion Slide - 5-10 reps - 1 sets - 10 sec hold - 2x daily - 7x weekly  Patient Education  TENS Unit

## 2018-12-14 NOTE — Therapy (Signed)
[facility] [address] [place], [place], [address] Phone: [phone]   Fax:  [phone]  Physical Therapy Evaluation  Patient Details  Name: Roger Fox MRN: [id number] Date of Birth: 11/09/1990 Referring Provider (PT): Dr Benjamin Stainhekkekandam    Encounter Date: 12/14/2018  PT End of Session - 12/14/18 1505    Visit Number  1    Number of Visits  12    Date for PT Re-Evaluation  01/25/19    PT Start Time  1503    PT Stop Time  1554    PT Time Calculation (min)  51 min    Activity Tolerance  Patient tolerated treatment well       Past Medical History:  Diagnosis Date  . No pertinent past medical history     Past Surgical History:  Procedure Laterality Date  . TONSILLECTOMY    . WISDOM TOOTH EXTRACTION      There were no vitals filed for this visit.   Subjective Assessment - 12/14/18 1508    Subjective  Cleaning pool with 3 yr old son who fell into the pool to get his son when he hit the Rt knee on the bottom of the pool sustaining dislocation to Rt knee. He relocated his own knee and was seen in urgent care and placed in knee immobilizer. He is now in knee immobilizer for 2 weeks.    Pertinent History  Dislocation Lt knee at 28 yr old; multiple times(10-14times) last in 2014    Diagnostic tests  Xray shallow patellar grove; patella alta    Patient Stated Goals  regain mobility and strength of Rt knee and prevent recurrent injury - gain confidence    Currently in Pain?  Yes    Pain Score  3     Pain Location  Knee    Pain Orientation  Right    Pain Descriptors / Indicators  Sore;Dull    Pain Type  Acute pain    Pain Onset  1 to 4 weeks ago    Pain Frequency  Intermittent    Aggravating Factors   turning LE in different difections; straightening knee; DF foot; bending knee    Pain Relieving Factors  ice; meds         [facility] PT Assessment - 12/14/18 0001      Assessment   Medical Diagnosis  Rt patella  dislocation     Referring Provider (PT)  Dr Benjamin Stainhekkekandam     Onset Date/Surgical Date  11/24/18    Hand Dominance  Left    Next MD Visit  12/28/2018    Prior Therapy  for Lt knee       Precautions   Precautions  None      Restrictions   Weight Bearing Restrictions  Yes    RLE Weight Bearing  Non weight bearing    Other Position/Activity Restrictions  for one week per pt - nothing in MD note       Balance Screen   Has the patient fallen in the past 6 months  Yes    How many times?  1    Has the patient had a decrease in activity level because of a fear of falling?   No    Is the patient reluctant to leave their home because of a fear of falling?   No      Prior Function   Level of Independence  Independent    Vocation  Full time employment  Vocation Requirements  desk/finance/computer     Leisure  yadr work; 8 mo old  & 3 yr old children      Observation/Other Assessments   Focus on Therapeutic Outcomes (FOTO)   71% limitation       Observation/Other Assessments-Edema    Edema  --   edema Rt ankle, calf, knee      Sensation   Additional Comments  numbness in Rt foot       Posture/Postural Control   Posture Comments  stands with wt shifted to the Lt       AROM   Right/Left Hip  --   WFL's bilat    Right Knee Extension  0    Right Knee Flexion  25    Left Knee Flexion  --   full flexion without limits    Right/Left Ankle  --   WFL's edema slightly limiting end range Rt ankle      Strength   Right/Left Hip  --   unable to fully assess Rt LE strength - pt apprehensive    Right Hip Flexion  4+/5    Left Hip Flexion  5/5    Left Hip Extension  5/5    Left Hip ABduction  5/5    Right/Left Knee  --   NT    Right/Left Ankle  --   WFL's bilat      Flexibility   Hamstrings  tight Rt    Quadriceps  unable to assess - tight Rt     ITB  tight Rt     Piriformis  unable to assess       Palpation   Patella mobility  laterally tracking patella     Palpation comment   muscular tightness Rt quads - especially distally; tender medial and lateral Rt knee through fascia and connective tissue - pt apprehensive with palpation       Transfers   Comments  poor transfers with incorrect use of crutches to assist with sit to stand       Ambulation/Gait   Gait Comments  ambulates with axillary crutches with NWB gait Rt LE - incorrect gait pattern - improved with TDWB Rt LE in shoe with instruction in gait sequence                 Objective measurements completed on examination: See above findings.      [facility] Adult PT Treatment/Exercise - 12/14/18 0001      Ambulation/Gait   Ambulation/Gait  Yes    Ambulation Distance (Feet)  20 Feet   with walker and with axillary crutches    Assistive device  Rolling walker;Crutches    Gait Pattern  Step-to pattern;Decreased stride length;Decreased weight shift to right    Ambulation Surface  Level    Gait velocity  slowed      Self-Care   Self-Care  --   education in elevation Rt LE w/ankle ex to decrease edema      Neuro Re-ed    Neuro Re-ed Details   workiong on standing balance and wt bearing Rt LE - pt standing and talking ~ 20 steps with walker       Knee/Hip Exercises: Stretches   Passive Hamstring Stretch  Right;2 reps;30 seconds    Quad Stretch Limitations  knee flexion in sitting allowing pt to work to his tolerance       Knee/Hip Exercises: Supine   Other Supine Knee/Hip Exercises  ankle pumps/circles CW-CCW/ankle alphabet(1 rep) x 10-15  reps       Midwife Action  TENS    Electrical Stimulation Parameters  to tolerance    Electrical Stimulation Goals  Pain;Tone      Manual Therapy   Kinesiotex  --   for patellar alignment Rt            PT Education - 12/14/18 1550    Education Details  HEP POC TENS    Person(s) Educated  Patient    Methods  Explanation;Demonstration;Tactile cues;Verbal  cues;Handout    Comprehension  Verbalized understanding;Returned demonstration;Verbal cues required;Tactile cues required          PT Long Term Goals - 12/14/18 1740      PT LONG TERM GOAL #1   Title  Increase AROM Rt knee to 120 deg flexion 01/25/2019    Time  6    Period  Weeks    Status  New      PT LONG TERM GOAL #2   Title  Increase strength Rt LE to 4+/5 to 5/5 throughout 01/25/2019    Time  6    Period  Weeks    Status  New      PT LONG TERM GOAL #3   Title  Ambulate without assistive device with normal gait pattern 01/25/2019    Time  6    Period  Weeks    Status  New      PT LONG TERM GOAL #4   Title  Independent in HEP 01/25/2019    Time  6    Period  Weeks    Status  New      PT LONG TERM GOAL #5   Title  Improve FOTO to </= 36% limitation 01/25/2019    Time  6    Period  Weeks    Status  New             Plan - 12/14/18 1730    Clinical Impression Statement  Roger Fox presents s/p dislocation Rt patella 11/24/2018. He has been NWB and in a knee immobilizer since the time of injury. Patient has continued pain as well as apprehension about any wt bearing or bending his knee. He has abnormal, dependent gait pattern; edema Lt LE; decreased ROM, strength, function Rt LE. Patient will benefit from PT to address problems identified.    Stability/Clinical Decision Making  Stable/Uncomplicated    Clinical Decision Making  Low    Rehab Potential  Good    PT Frequency  2x / week    PT Duration  6 weeks    PT Treatment/Interventions  Patient/family education;ADLs/Self Care Home Management;Cryotherapy;Electrical Stimulation;Iontophoresis 4mg /ml Dexamethasone;Moist Heat;Ultrasound;Manual techniques;Dry needling;Neuromuscular re-education;Gait training;Stair training;Functional mobility training;Therapeutic activities;Therapeutic exercise;Balance training;Vasopneumatic Device;Taping    PT Next Visit Plan  review HEP; bend/bend/bend knee; begin strengthening; gait training;  manual work and modalities as indicated    PT Mucarabones       Patient will benefit from skilled therapeutic intervention in order to improve the following deficits and impairments:  Pain, Increased fascial restricitons, Increased muscle spasms, Hypermobility, Decreased strength, Decreased range of motion, Decreased mobility, Abnormal gait, Decreased activity tolerance  Visit Diagnosis: 1. Acute pain of right knee   2. Muscle weakness (generalized)   3. Stiffness of right knee, not elsewhere classified   4. Other abnormalities of gait and mobility        Problem List Patient Active  Problem List   Diagnosis Date Noted  . Patellar dislocation 04/07/2013    Kadence Mimbs Rober MinionP Jameel Quant PT, MPH  12/14/2018, 5:55 PM  [facility] [address] [place], [place], [address] Phone: [phone]   Fax:  [phone]  Name: Roger PernaKelsey Coppess MRN: [id number] Date of Birth: 07/21/1990

## 2018-12-15 ENCOUNTER — Telehealth: Payer: Self-pay | Admitting: Rehabilitative and Restorative Service Providers"

## 2018-12-15 NOTE — Telephone Encounter (Addendum)
Called patient to check on how he was doing following PT and address questions he had about WB status and splint. Suggested he contact Dr Dianah Field via My Chart for clarification. Will continue with current exercise and gait for home until seen for next PT appointment.    Roger Fox P. Helene Kelp PT, MPH 12/15/18 1:00 PM  12/16/2018 Received message from Dr Dianah Field and called [name] to relay information.  - weight bearing as tolerated -can use patellar brace -remain in the patellar brace at night x 2 weeks  Kent Braunschweig P. Helene Kelp PT, MPH 12/16/18 7:50 AM

## 2018-12-18 ENCOUNTER — Ambulatory Visit (INDEPENDENT_AMBULATORY_CARE_PROVIDER_SITE_OTHER): Payer: BC Managed Care – PPO | Admitting: Physical Therapy

## 2018-12-18 ENCOUNTER — Other Ambulatory Visit: Payer: Self-pay

## 2018-12-18 ENCOUNTER — Encounter: Payer: Self-pay | Admitting: Physical Therapy

## 2018-12-18 DIAGNOSIS — M25561 Pain in right knee: Secondary | ICD-10-CM | POA: Diagnosis not present

## 2018-12-18 DIAGNOSIS — M25661 Stiffness of right knee, not elsewhere classified: Secondary | ICD-10-CM

## 2018-12-18 DIAGNOSIS — M6281 Muscle weakness (generalized): Secondary | ICD-10-CM | POA: Diagnosis not present

## 2018-12-18 NOTE — Therapy (Signed)
[facility] [address] [place], [place], [address] Phone: [phone]   Fax:  [phone]  Physical Therapy Treatment  Patient Details  Name: Roger PernaKelsey Fox MRN: [id number] Date of Birth: 03/26/1991 Referring Provider (PT): Dr Benjamin Stainhekkekandam    Encounter Date: 12/18/2018  PT End of Session - 12/18/18 1425    Visit Number  2    Number of Visits  12    Date for PT Re-Evaluation  01/25/19    PT Start Time  1425    PT Stop Time  1520    PT Time Calculation (min)  55 min    Activity Tolerance  Patient tolerated treatment well    Behavior During Therapy  [facility] for tasks assessed/performed       Past Medical History:  Diagnosis Date  . No pertinent past medical history     Past Surgical History:  Procedure Laterality Date  . TONSILLECTOMY    . WISDOM TOOTH EXTRACTION      There were no vitals filed for this visit.  Subjective Assessment - 12/18/18 1431    Subjective  Pt reports the pool has been good, to "get in and stretch it (Rt knee) out.  Pt reports the bending exercises have been difficult.  He has not been able to sleep due to being uncomfortable.    Patient Stated Goals  regain mobility and strength of Rt knee and prevent recurrent injury - gain confidence    Currently in Pain?  Yes    Pain Score  4     Pain Location  Knee    Pain Orientation  Right    Pain Descriptors / Indicators  Dull;Sore;Aching    Aggravating Factors   extension of knee    Pain Relieving Factors  ice, medicine         [facility] - 12/18/18 0001      Assessment   Medical Diagnosis  Rt patella dislocation     Referring Provider (PT)  Dr Benjamin Stainhekkekandam     Onset Date/Surgical Date  11/24/18    Hand Dominance  Left    Next MD Visit  12/28/2018    Prior Therapy  for Lt knee       Restrictions   Weight Bearing Restrictions  Yes    RLE Weight Bearing  Weight bearing as tolerated   per MD     AROM   Right Knee Extension  0    Right Knee Flexion  115   AAROM heel slide      [facility] Adult PT Treatment/Exercise - 12/18/18 0001      Ambulation/Gait   Ambulation Distance (Feet)  160 Feet    Assistive device  Crutches    Gait Pattern  Step-through pattern;Decreased weight shift to right;Right circumduction    Ambulation Surface  Level;Indoor    Gait velocity  slowed    Pre-Gait Activities  Rt knee flex/ext in staggered stance x 10 prior to gait    Gait Comments  VC for increased Rt DF at heel strike, straight knee for stance and increased Rt knee flexion during swing through (initially circumducting LE; improved quality with cues and repetition)      Knee/Hip Exercises: Stretches   LobbyistQuad Stretch  Right;3 reps;30 seconds   prone with strap   Gastroc Stretch  Both;1 rep;60 seconds   incline board     Knee/Hip Exercises: Aerobic   Recumbent Bike  partial revolutions to increase Rt knee ROM x 5 min  Knee/Hip Exercises: Standing   Other Standing Knee Exercises  weight shifts onto scale (Rt foot foward, Lt ft back) x 10 - pt bearing up to 60%, holding axillary crutches.       Knee/Hip Exercises: Seated   Other Seated Knee/Hip Exercises  PF/DF with heel on ground with blue band to simulate brake to gas x 20 reps   (Pended)       Knee/Hip Exercises: Supine   Quad Sets  Strengthening;Right;1 set;5 reps   10 seconds   Heel Slides  AAROM;Right;1 set    Straight Leg Raises  Right;1 set;10 reps                  PT Long Term Goals - 12/14/18 1740      PT LONG TERM GOAL #1   Title  Increase AROM Rt knee to 120 deg flexion 01/25/2019    Time  6    Period  Weeks    Status  New      PT LONG TERM GOAL #2   Title  Increase strength Rt LE to 4+/5 to 5/5 throughout 01/25/2019    Time  6    Period  Weeks    Status  New      PT LONG TERM GOAL #3   Title  Ambulate without assistive device with normal gait pattern 01/25/2019    Time  6    Period  Weeks    Status  New      PT LONG TERM GOAL #4   Title   Independent in HEP 01/25/2019    Time  6    Period  Weeks    Status  New      PT LONG TERM GOAL #5   Title  Improve FOTO to </= 36% limitation 01/25/2019    Time  6    Period  Weeks    Status  New            Plan - 12/18/18 1534    Clinical Impression Statement  Significant improvement in Rt knee flexion ROM.  Pt tolerated exercises well, with mild increase in discomfort.  Pt reported reduction in pain with use of vaso at end of session.  Progressing well towards goals.    Stability/Clinical Decision Making  Stable/Uncomplicated    Rehab Potential  Good    PT Frequency  2x / week    PT Duration  6 weeks    PT Treatment/Interventions  Patient/family education;ADLs/Self Care Home Management;Cryotherapy;Electrical Stimulation;Iontophoresis 4mg /ml Dexamethasone;Moist Heat;Ultrasound;Manual techniques;Dry needling;Neuromuscular re-education;Gait training;Stair training;Functional mobility training;Therapeutic activities;Therapeutic exercise;Balance training;Vasopneumatic Device;Taping    PT Next Visit Plan  review HEP; bend/bend/bend knee; begin strengthening; gait training; manual work and modalities as indicated    PT Home Exercise Plan  [phone]       Patient will benefit from skilled therapeutic intervention in order to improve the following deficits and impairments:  Pain, Increased fascial restricitons, Increased muscle spasms, Hypermobility, Decreased strength, Decreased range of motion, Decreased mobility, Abnormal gait, Decreased activity tolerance  Visit Diagnosis: 1. Acute pain of right knee   2. Muscle weakness (generalized)   3. Stiffness of right knee, not elsewhere classified        Problem List Patient Active Problem List   Diagnosis Date Noted  . Patellar dislocation 04/07/2013   Mayer CamelJennifer Fox, PTA 12/18/18 3:37 PM  [facility] [place] [address] [place], [place], [address] Phone: [phone]    Fax:  [phone]  Name:  Desmin Daleo MRN: [id number] Date of Birth: 19-Apr-1991

## 2018-12-21 ENCOUNTER — Encounter: Payer: Self-pay | Admitting: Rehabilitative and Restorative Service Providers"

## 2018-12-21 ENCOUNTER — Other Ambulatory Visit: Payer: Self-pay

## 2018-12-21 ENCOUNTER — Ambulatory Visit (INDEPENDENT_AMBULATORY_CARE_PROVIDER_SITE_OTHER): Payer: BC Managed Care – PPO | Admitting: Rehabilitative and Restorative Service Providers"

## 2018-12-21 DIAGNOSIS — M25661 Stiffness of right knee, not elsewhere classified: Secondary | ICD-10-CM

## 2018-12-21 DIAGNOSIS — M25561 Pain in right knee: Secondary | ICD-10-CM | POA: Diagnosis not present

## 2018-12-21 DIAGNOSIS — M6281 Muscle weakness (generalized): Secondary | ICD-10-CM | POA: Diagnosis not present

## 2018-12-21 DIAGNOSIS — R2689 Other abnormalities of gait and mobility: Secondary | ICD-10-CM

## 2018-12-21 NOTE — Therapy (Signed)
[facility] [address] [place], [place], [address] Phone: [phone]   Fax:  [phone]  Physical Therapy Treatment  Patient Details  Name: Roger Fox MRN: [id number] Date of Birth: 09/19/1990 Referring Provider (PT): Dr Benjamin Stainhekkekandam    Encounter Date: 12/21/2018  PT End of Session - 12/21/18 1405    Visit Number  3    Number of Visits  12    Date for PT Re-Evaluation  01/25/19    PT Start Time  1402    PT Stop Time  1452    PT Time Calculation (min)  50 min    Activity Tolerance  Patient tolerated treatment well       Past Medical History:  Diagnosis Date  . No pertinent past medical history     Past Surgical History:  Procedure Laterality Date  . TONSILLECTOMY    . WISDOM TOOTH EXTRACTION      There were no vitals filed for this visit.  Subjective Assessment - 12/21/18 1406    Subjective  Patient reports that he is working in the pool at home and working on his exercises. He is trying to put more weight on his Rt leg when walking. He is only taking advil now - not pain meds. Sleeping with the brace and on his side.    Currently in Pain?  Yes    Pain Score  3     Pain Location  Knee    Pain Orientation  Right    Pain Descriptors / Indicators  Aching;Dull;Sore    Pain Type  Acute pain    Pain Onset  1 to 4 weeks ago    Pain Frequency  Intermittent                       [facility] Adult PT Treatment/Exercise - 12/21/18 0001      Knee/Hip Exercises: Stretches   Gastroc Stretch  Right;2 reps;30 seconds      Knee/Hip Exercises: Aerobic   Recumbent Bike  full revolution without resistance x ~ 5 min - L2 resistance x 6 min       Knee/Hip Exercises: Standing   Functional Squat Limitations  partial stand to squat - partial range x 10 slow and controlled motion focus on equal wt bearing     Other Standing Knee Exercises  weigth shift laterally x 1-2 min; stagger stance each foot fwd for shift  fwd/back focus on knee control and smooth transfer of weight. 1-2 min each LE - walker in front of pt for security - seldom using walker     Other Standing Knee Exercises  standing with equal wt bearing for variety of UE movements to challenge balance       Knee/Hip Exercises: Supine   Hip Adduction Isometric  Strengthening;Both;10 reps   ball squeeze 5 sec hold    Straight Leg Raises  Right;1 set;10 reps   10-12 inches slow eccentric lowering      Knee/Hip Exercises: Sidelying   Hip ABduction  AROM;Strengthening;Right;10 reps   2-3 sec hold lowering slowly             PT Education - 12/21/18 1439    Education Details  HEP    Person(s) Educated  Patient    Methods  Explanation;Demonstration;Tactile cues;Verbal cues;Handout    Comprehension  Verbalized understanding;Returned demonstration;Verbal cues required;Tactile cues required          PT Long Term Goals - 12/14/18 1740  PT LONG TERM GOAL #1   Title  Increase AROM Rt knee to 120 deg flexion 01/25/2019    Time  6    Period  Weeks    Status  New      PT LONG TERM GOAL #2   Title  Increase strength Rt LE to 4+/5 to 5/5 throughout 01/25/2019    Time  6    Period  Weeks    Status  New      PT LONG TERM GOAL #3   Title  Ambulate without assistive device with normal gait pattern 01/25/2019    Time  6    Period  Weeks    Status  New      PT LONG TERM GOAL #4   Title  Independent in HEP 01/25/2019    Time  6    Period  Weeks    Status  New      PT LONG TERM GOAL #5   Title  Improve FOTO to </= 36% limitation 01/25/2019    Time  6    Period  Weeks    Status  New            Plan - 12/21/18 1406    Clinical Impression Statement  Full revolution on bake and able to tolerate some resistance today. Walking with improved gait pattern and increased wt bearing Rt LE. ROM increasing. Gradually progressing toward stated goals of therapy.    Stability/Clinical Decision Making  Stable/Uncomplicated    Rehab  Potential  Good    PT Frequency  2x / week    PT Duration  6 weeks    PT Treatment/Interventions  Patient/family education;ADLs/Self Care Home Management;Cryotherapy;Electrical Stimulation;Iontophoresis 4mg /ml Dexamethasone;Moist Heat;Ultrasound;Manual techniques;Dry needling;Neuromuscular re-education;Gait training;Stair training;Functional mobility training;Therapeutic activities;Therapeutic exercise;Balance training;Vasopneumatic Device;Taping    PT Next Visit Plan  review HEP; bend/bend/bend knee; begin strengthening; gait training; manual work and modalities as indicated    PT [place]       Patient will benefit from skilled therapeutic intervention in order to improve the following deficits and impairments:  Pain, Increased fascial restricitons, Increased muscle spasms, Hypermobility, Decreased strength, Decreased range of motion, Decreased mobility, Abnormal gait, Decreased activity tolerance  Visit Diagnosis: 1. Acute pain of right knee   2. Muscle weakness (generalized)   3. Stiffness of right knee, not elsewhere classified   4. Other abnormalities of gait and mobility        Problem List Patient Active Problem List   Diagnosis Date Noted  . Patellar dislocation 04/07/2013    Sandford Diop Nilda Simmer PT, MPH  12/21/2018, 2:58 PM  [facility] [address] [place], [place], [address] Phone: [phone]   Fax:  [phone]  Name: Roger Fox MRN: [id number] Date of Birth: 07/14/1990

## 2018-12-21 NOTE — Patient Instructions (Addendum)
Standing with feet even - shift weight side to side   Standing with one foot forward one back half a step and shift weight to forward leg then back - do not lock knees    Functional Quadriceps: Chair Squat    Keeping feet flat on floor, shoulder width apart, shallow squat. Use support as necessary for safety. Repeat _5-10___ times per set. Do _1-2___ sets per session. Do _2 ___ sessions per day.   Strengthening: Hip Adduction - Isometric    With ball or folded pillow between knees, squeeze knees together. Hold ___5_ seconds. Repeat __10__ times per set. Do __1-2__ sets per session. Do __1-2__ sessions per day.      Tighten muscles on front of right thigh, then lift leg _8-10___ inches from surface, keeping knee locked.  Repeat __10__ times per set. Do __1-3__ sets per session. Do _1_ sessions per day.    Standing in pool or at counter and move arms in a variety of ways - which will challenge core and legs   Strengthening: Hip Adduction (Side-Lying)

## 2018-12-25 ENCOUNTER — Encounter (INDEPENDENT_AMBULATORY_CARE_PROVIDER_SITE_OTHER): Payer: Self-pay

## 2018-12-25 ENCOUNTER — Ambulatory Visit (INDEPENDENT_AMBULATORY_CARE_PROVIDER_SITE_OTHER): Payer: BC Managed Care – PPO | Admitting: Rehabilitative and Restorative Service Providers"

## 2018-12-25 ENCOUNTER — Encounter: Payer: Self-pay | Admitting: Rehabilitative and Restorative Service Providers"

## 2018-12-25 ENCOUNTER — Other Ambulatory Visit: Payer: Self-pay

## 2018-12-25 DIAGNOSIS — M6281 Muscle weakness (generalized): Secondary | ICD-10-CM | POA: Diagnosis not present

## 2018-12-25 DIAGNOSIS — R2689 Other abnormalities of gait and mobility: Secondary | ICD-10-CM

## 2018-12-25 DIAGNOSIS — M25661 Stiffness of right knee, not elsewhere classified: Secondary | ICD-10-CM

## 2018-12-25 DIAGNOSIS — M25561 Pain in right knee: Secondary | ICD-10-CM

## 2018-12-25 NOTE — Patient Instructions (Signed)
Access Code: [email or web address]  URL: [email or web address]  Date: 12/25/2018  Prepared by: Gillermo Murdoch   Exercises  Straight Leg Raise - 10 reps - 2 sets - 1-2x daily - 7x weekly  Supine Hamstring Stretch with Strap - 3 reps - 1 sets - 30 seconds hold - 2x daily - 7x weekly  Seated Quad Set - 10 reps - 1 sets - 5-10 seconds hold - 2x daily - 7x weekly  Seated Knee Flexion Slide - 5-10 reps - 1 sets - 10 sec hold - 2x daily - 7x weekly  Prone Quadriceps Stretch with Strap - 3 reps - 1 sets - 30 seconds hold - 1x daily - 7x weekly  Supine Heel Slide with Strap - 10 reps - 1 sets - 10 sonds hold - 1x daily - 7x weekly  Supine Knee Extension Strengthening - 10 reps - 1-2 sets - 5 sec hold - 1x daily - 7x weekly  Supine Bridge with Mini Swiss Ball Between Knees - 10 reps - 1-2 sets - 5 sec hold - 1x daily - 7x weekly  Step Up - 5 reps - 1 sets - 1-2x daily - 7x weekly

## 2018-12-25 NOTE — Therapy (Signed)
[facility] [address] [place], [place], [address] Phone: [phone]   Fax:  [phone]  Physical Therapy Treatment  Patient Details  Name: Roger Fox MRN: [id number] Date of Birth: 05/16/1991 Referring Provider (PT): Dr Benjamin Stainhekkekandam    Encounter Date: 12/25/2018  PT End of Session - 12/25/18 0903    Visit Number  4    Number of Visits  12    Date for PT Re-Evaluation  01/25/19    PT Start Time  0902    PT Stop Time  0955    PT Time Calculation (min)  53 min    Activity Tolerance  Patient tolerated treatment well       Past Medical History:  Diagnosis Date  . No pertinent past medical history     Past Surgical History:  Procedure Laterality Date  . TONSILLECTOMY    . WISDOM TOOTH EXTRACTION      There were no vitals filed for this visit.  Subjective Assessment - 12/25/18 0904    Subjective  Patient reports that he continues to work on his exercises at home in the pool and on land. He walking in the pool at 3 foot level. "scary"    Currently in Pain?  Yes    Pain Score  3     Pain Location  Knee    Pain Orientation  Right    Pain Descriptors / Indicators  Aching;Dull;Sore    Pain Type  Acute pain    Pain Onset  More than a month ago    Pain Frequency  Intermittent    Aggravating Factors   extension of the knee; holding the knee stiff    Pain Relieving Factors  ice; medicine                       [facility] Adult PT Treatment/Exercise - 12/25/18 0001      Knee/Hip Exercises: Stretches   Gastroc Stretch  Right;3 reps;30 seconds      Knee/Hip Exercises: Aerobic   Recumbent Bike  L2 x 7 min full revolution for the entire time        Knee/Hip Exercises: Standing   Forward Step Up  Right;5 reps;Hand Hold: 2;Step Height: 4"    Functional Squat Limitations  partial stand to squat - partial range x 10 slow and controlled motion focus on equal wt bearing     Other Standing Knee Exercises   weigth shift laterally x 1-2 min; stagger stance each foot fwd for shift fwd/back focus on knee control and smooth transfer of weight. 1-2 min each LE - walker in front of pt for security - seldom using walker     Other Standing Knee Exercises  ambulation with one crutch Lt UE with verbal cues for wt shift and gait pattern - improved with proctice - needs continued work to wean from crutches      Knee/Hip Exercises: Seated   Sit to Sand  5 reps;with UE support   UE assist as needed - slow eccentric control       Knee/Hip Exercises: Supine   Quad Sets  Strengthening;Right;10 reps   5 sec hold    Short Arc Quad Sets  Strengthening;Right;10 reps   thigh supported on bolster 5 sec hold slowly lowering    Hip Adduction Isometric  Strengthening;Both;10 reps   ball squeeze 5 sec hold    Bridges with Clamshell  Strengthening;Both;10 reps   ball squeeze  Straight Leg Raise with External Rotation  Strengthening;Right;10 reps   5 sec hold      Vasopneumatic   Number Minutes Vasopneumatic   15 minutes    Vasopnuematic Location   Knee   Rt    Vasopneumatic Pressure  Low    Vasopneumatic Temperature   34 deg              PT Education - 12/25/18 0951    Education Details  HEP    Person(s) Educated  Patient    Methods  Explanation;Demonstration;Tactile cues;Verbal cues;Handout    Comprehension  Verbalized understanding;Returned demonstration;Verbal cues required;Tactile cues required          PT Long Term Goals - 12/14/18 1740      PT LONG TERM GOAL #1   Title  Increase AROM Rt knee to 120 deg flexion 01/25/2019    Time  6    Period  Weeks    Status  New      PT LONG TERM GOAL #2   Title  Increase strength Rt LE to 4+/5 to 5/5 throughout 01/25/2019    Time  6    Period  Weeks    Status  New      PT LONG TERM GOAL #3   Title  Ambulate without assistive device with normal gait pattern 01/25/2019    Time  6    Period  Weeks    Status  New      PT LONG TERM GOAL #4    Title  Independent in HEP 01/25/2019    Time  6    Period  Weeks    Status  New      PT LONG TERM GOAL #5   Title  Improve FOTO to </= 36% limitation 01/25/2019    Time  6    Period  Weeks    Status  New            Plan - 12/25/18 0903    Clinical Impression Statement  ROM now WFL's Rt knee. Progressed with exercise with minimal discomfort. Worked on ambulation with one crutch - patient uncomfortable with this but improved with proctice. Continues to progress with rehab.    Stability/Clinical Decision Making  Stable/Uncomplicated    Rehab Potential  Good    PT Frequency  2x / week    PT Duration  6 weeks    PT Treatment/Interventions  Patient/family education;ADLs/Self Care Home Management;Cryotherapy;Electrical Stimulation;Iontophoresis 4mg /ml Dexamethasone;Moist Heat;Ultrasound;Manual techniques;Dry needling;Neuromuscular re-education;Gait training;Stair training;Functional mobility training;Therapeutic activities;Therapeutic exercise;Balance training;Vasopneumatic Device;Taping    PT Next Visit Plan  review HEP; bend/bend/bend knee; begin strengthening; gait training; manual work and modalities as indicated    PT Home Exercise Plan  [phone] [id number]       Patient will benefit from skilled therapeutic intervention in order to improve the following deficits and impairments:  Pain, Increased fascial restricitons, Increased muscle spasms, Hypermobility, Decreased strength, Decreased range of motion, Decreased mobility, Abnormal gait, Decreased activity tolerance  Visit Diagnosis: 1. Acute pain of right knee   2. Muscle weakness (generalized)   3. Stiffness of right knee, not elsewhere classified   4. Other abnormalities of gait and mobility        Problem List Patient Active Problem List   Diagnosis Date Noted  . Patellar dislocation 04/07/2013    [place] Perra Rober MinionP Jillisa Harris PT, MPH  12/25/2018, 9:52 AM  [facility] [address] [place] [address] [place], [place], [address] Phone: [phone]   Fax:  [phone]  Name: Roger Fox MRN: [id number] Date of Birth: 23-Dec-1990

## 2018-12-28 ENCOUNTER — Encounter: Payer: Self-pay | Admitting: Sports Medicine

## 2018-12-28 ENCOUNTER — Ambulatory Visit (INDEPENDENT_AMBULATORY_CARE_PROVIDER_SITE_OTHER): Payer: BC Managed Care – PPO | Admitting: Sports Medicine

## 2018-12-28 ENCOUNTER — Other Ambulatory Visit: Payer: Self-pay

## 2018-12-28 DIAGNOSIS — S83004D Unspecified dislocation of right patella, subsequent encounter: Secondary | ICD-10-CM | POA: Diagnosis not present

## 2018-12-28 DIAGNOSIS — B356 Tinea cruris: Secondary | ICD-10-CM | POA: Diagnosis not present

## 2018-12-28 MED ORDER — CLOTRIMAZOLE-BETAMETHASONE 1-0.05 % EX CREA: 1.0000 "application " | TOPICAL_CREAM | Freq: Two times a day (BID) | CUTANEOUS | 0 refills | Status: DC

## 2018-12-28 MED ORDER — FLUCONAZOLE 200 MG PO TABS: 200.0000 mg | ORAL_TABLET | ORAL | 0 refills | Status: DC

## 2018-12-28 NOTE — Assessment & Plan Note (Signed)
Continues to improve. Continue therapy and partial weightbearing with a single crutch for the next 6 weeks, return to see me afterwards.

## 2018-12-28 NOTE — Progress Notes (Signed)
Subjective:    CC: Follow-up  HPI: Roger Fox is a pleasant 28 year old male, he dislocated his right patella, he is improving with physical therapy, bracing, but still has significant discomfort and distress in his knee.  In addition he has a rash in his groin.  I reviewed the past medical history, family history, social history, surgical history, and allergies today and no changes were needed.  Please see the problem list section below in epic for further details.  Past Medical History: Past Medical History:  Diagnosis Date  . No pertinent past medical history    Past Surgical History: Past Surgical History:  Procedure Laterality Date  . TONSILLECTOMY    . WISDOM TOOTH EXTRACTION     Social History: Social History   Socioeconomic History  . Marital status: Married    Spouse name: Not on file  . Number of children: Not on file  . Years of education: Not on file  . Highest education level: Not on file  Occupational History  . Not on file  Social Needs  . Financial resource strain: Not on file  . Food insecurity    Worry: Not on file    Inability: Not on file  . Transportation needs    Medical: Not on file    Non-medical: Not on file  Tobacco Use  . Smoking status: Former Research scientist (life sciences)  . Smokeless tobacco: Never Used  Substance and Sexual Activity  . Alcohol use: Yes  . Drug use: No  . Sexual activity: Not on file  Lifestyle  . Physical activity    Days per week: Not on file    Minutes per session: Not on file  . Stress: Not on file  Relationships  . Social Herbalist on phone: Not on file    Gets together: Not on file    Attends religious service: Not on file    Active member of club or organization: Not on file    Attends meetings of clubs or organizations: Not on file    Relationship status: Not on file  Other Topics Concern  . Not on file  Social History Narrative  . Not on file   Family History: Family History  Problem Relation Age of Onset  .  Diabetes Other   . Stroke Other   . Cancer Maternal Grandfather        colon   Allergies: Allergies  Allergen Reactions  . Sulfur    Medications: See med rec.  Review of Systems: No fevers, chills, night sweats, weight loss, chest pain, or shortness of breath.   Objective:    General: Well Developed, well nourished, and in no acute distress.  Neuro: Alert and oriented x3, extra-ocular muscles intact, sensation grossly intact.  HEENT: Normocephalic, atraumatic, pupils equal round reactive to light, neck supple, no masses, no lymphadenopathy, thyroid nonpalpable.  Skin: Warm and dry, very mild tinea cruris. Cardiac: Regular rate and rhythm, no murmurs rubs or gallops, no lower extremity edema.  Respiratory: Clear to auscultation bilaterally. Not using accessory muscles, speaking in full sentences.  Impression and Recommendations:    Patellar dislocation Continues to improve. Continue therapy and partial weightbearing with a single crutch for the next 6 weeks, return to see me afterwards.  Tinea cruris Fluconazole, Lotrisone, barrier cream. Keep follow-up for this with PCP.  I spent 25 minutes with this patient, greater than 50% was face-to-face time counseling regarding the above diagnoses.  ___________________________________________ Gwen Her. Dianah Field, M.D., ABFM., CAQSM. Primary Care and  Sports Medicine [facility]  Adjunct Professor of [employer]

## 2018-12-28 NOTE — Assessment & Plan Note (Signed)
Fluconazole, Lotrisone, barrier cream. Keep follow-up for this with PCP.

## 2018-12-29 ENCOUNTER — Encounter: Payer: Self-pay | Admitting: Physical Therapy

## 2018-12-29 ENCOUNTER — Ambulatory Visit (INDEPENDENT_AMBULATORY_CARE_PROVIDER_SITE_OTHER): Payer: BC Managed Care – PPO | Admitting: Physical Therapy

## 2018-12-29 DIAGNOSIS — M6281 Muscle weakness (generalized): Secondary | ICD-10-CM

## 2018-12-29 DIAGNOSIS — M25661 Stiffness of right knee, not elsewhere classified: Secondary | ICD-10-CM | POA: Diagnosis not present

## 2018-12-29 DIAGNOSIS — M25561 Pain in right knee: Secondary | ICD-10-CM | POA: Diagnosis not present

## 2018-12-29 NOTE — Therapy (Signed)
[place], [place], [address] Phone: [phone]   Fax:  [phone]  Physical Therapy Treatment  Patient Details  Name: Nakota Elsen MRN: [id number] Date of Birth: [birth year] Referring Provider (PT): Dr Dianah Field    Encounter Date: 12/29/2018  PT End of Session - 12/29/18 0939    Visit Number  5    Number of Visits  12    Date for PT Re-Evaluation  01/25/19    PT Start Time  0932    PT Stop Time  1017    PT Time Calculation (min)  45 min    Activity Tolerance  Patient tolerated treatment well       Past Medical History:  Diagnosis Date  . No pertinent past medical history     Past Surgical History:  Procedure Laterality Date  . TONSILLECTOMY    . WISDOM TOOTH EXTRACTION      There were no vitals filed for this visit.  Subjective Assessment - 12/29/18 0940    Subjective  Pt reports he has been working on walking with one crutch. He still has occasions when knee hyperextends, when his leg is tired, and this is painful.    Diagnostic tests  Xray shallow patellar grove; patella alta    Patient Stated Goals  regain mobility and strength of Rt knee and prevent recurrent injury - gain confidence    Currently in Pain?  Yes    Pain Score  2     Pain Location  Knee    Pain Orientation  Right;Medial;Lateral    Pain Descriptors / Indicators  Dull;Sore    Aggravating Factors   extension of knee    Pain Relieving Factors  ice, medicine         [facility] PT Assessment - 12/29/18 0001      Assessment   Medical Diagnosis  Rt patella dislocation     Referring Provider (PT)  Dr Dianah Field     Onset Date/Surgical Date  11/24/18    Hand Dominance  Left    Prior Therapy  for Lt knee       Restrictions   Weight Bearing Restrictions  Yes    RLE Weight Bearing  Weight bearing as tolerated   per MD     AROM   Right Knee Extension  0    Right Knee Flexion  145      Flexibility   Quadriceps   Rt - 140 deg        [facility] Adult PT Treatment/Exercise - 12/29/18 0001      Knee/Hip Exercises: Stretches   Sports administrator  Right;3 reps;30 seconds   prone   Gastroc Stretch  Right;3 reps;30 seconds      Knee/Hip Exercises: Aerobic   Recumbent Bike  L2 x 6 min       Knee/Hip Exercises: Standing   Step Down  Left;1 set;10 reps;Hand Hold: 2   3" step   Step Down Limitations  working on slow control of Rt knee terminal ext and quad control with slowly stepping down with Lt foot (challenging)    Other Standing Knee Exercises  tandem gait forward/ backward with light UE support on counter x 10 ft x 6 reps      Knee/Hip Exercises: Supine   Straight Leg Raise with External Rotation  Strengthening;Right;10 reps   10 sec hold    Straight Leg Raise with External Rotation Limitations  trial of 5 reps on elbows, then in  long sitting.       Knee/Hip Exercises: Sidelying   Hip ABduction  Strengthening;Right;1 set;10 reps    Hip ADduction  Strengthening;Right;1 set;10 reps      Vasopneumatic   Number Minutes Vasopneumatic   10 minutes    Vasopnuematic Location   Knee   Rt    Vasopneumatic Pressure  Low    Vasopneumatic Temperature   34 deg                   PT Long Term Goals - 12/29/18 1008      PT LONG TERM GOAL #1   Title  Increase AROM Rt knee to 120 deg flexion 01/25/2019    Time  6    Period  Weeks    Status  Achieved      PT LONG TERM GOAL #2   Title  Increase strength Rt LE to 4+/5 to 5/5 throughout 01/25/2019    Time  6    Period  Weeks    Status  On-going      PT LONG TERM GOAL #3   Title  Ambulate without assistive device with normal gait pattern 01/25/2019    Time  6    Period  Weeks    Status  On-going      PT LONG TERM GOAL #4   Title  Independent in HEP 01/25/2019    Time  6    Period  Weeks    Status  On-going      PT LONG TERM GOAL #5   Title  Improve FOTO to </= 36% limitation 01/25/2019    Time  6    Period  Weeks    Status  On-going             Plan - 12/29/18 1009    Clinical Impression Statement  Pt's Rt knee ROM improved; 0-145 deg; has met LTG #1.  Pt reported fear and pain with Rt knee terminal extension; avoids full ext during weight bearing exercises due to increased Rt knee pain. some difficulty tolerating step down exercise due to this.  Pt progressing well towards goals.  Per MD, pt continues to be WBAT- clarification received 12/29/18.   Stability/Clinical Decision Making  Stable/Uncomplicated    Rehab Potential  Good    PT Frequency  2x / week    PT Duration  6 weeks    PT Treatment/Interventions  Patient/family education;ADLs/Self Care Home Management;Cryotherapy;Electrical Stimulation;Iontophoresis 57m/ml Dexamethasone;Moist Heat;Ultrasound;Manual techniques;Dry needling;Neuromuscular re-education;Gait training;Stair training;Functional mobility training;Therapeutic activities;Therapeutic exercise;Balance training;Vasopneumatic Device;Taping    PT Next Visit Plan  continue strengthening and gait for Rt knee.    PT Home Exercise Plan  [id number]    Consulted and Agree with Plan of Care  Patient       Patient will benefit from skilled therapeutic intervention in order to improve the following deficits and impairments:  Pain, Increased fascial restricitons, Increased muscle spasms, Hypermobility, Decreased strength, Decreased range of motion, Decreased mobility, Abnormal gait, Decreased activity tolerance  Visit Diagnosis: 1. Acute pain of right knee   2. Muscle weakness (generalized)   3. Stiffness of right knee, not elsewhere classified        Problem List Patient Active Problem List   Diagnosis Date Noted  . Tinea cruris 12/28/2018  . Patellar dislocation 04/07/2013   JKerin Perna PTA 12/29/18 10:15 AM  [place] [address]Phone: [phone]  Fax:  [phone] Name:  Helmuth Recupero MRN:  [id number] Date of Birth: Oct 18, 1990

## 2018-12-31 ENCOUNTER — Ambulatory Visit (INDEPENDENT_AMBULATORY_CARE_PROVIDER_SITE_OTHER): Payer: BC Managed Care – PPO | Admitting: Physical Therapy

## 2018-12-31 DIAGNOSIS — M6281 Muscle weakness (generalized): Secondary | ICD-10-CM | POA: Diagnosis not present

## 2018-12-31 DIAGNOSIS — M25661 Stiffness of right knee, not elsewhere classified: Secondary | ICD-10-CM | POA: Diagnosis not present

## 2018-12-31 DIAGNOSIS — M25561 Pain in right knee: Secondary | ICD-10-CM | POA: Diagnosis not present

## 2018-12-31 DIAGNOSIS — R2689 Other abnormalities of gait and mobility: Secondary | ICD-10-CM | POA: Diagnosis not present

## 2018-12-31 NOTE — Therapy (Signed)
[facility] [address] [place], [place], [address] Phone: [phone]   Fax:  [phone]  Physical Therapy Treatment  Patient Details  Name: Roger Fox MRN: [id number] Date of Birth: 09/07/1990 Referring Provider (PT): Dr Benjamin Stainhekkekandam    Encounter Date: 12/31/2018  PT End of Session - 12/31/18 1442    Visit Number  6    Number of Visits  12    Date for PT Re-Evaluation  01/25/19    PT Start Time  1402    PT Stop Time  1450    PT Time Calculation (min)  48 min    Activity Tolerance  Patient tolerated treatment well    Behavior During Therapy  [facility] for tasks assessed/performed       Past Medical History:  Diagnosis Date  . No pertinent past medical history     Past Surgical History:  Procedure Laterality Date  . TONSILLECTOMY    . WISDOM TOOTH EXTRACTION      There were no vitals filed for this visit.  Subjective Assessment - 12/31/18 1413    Subjective  [name] reports he has been working on his gait. He rode his stationary bike for 10 min, may have turned intensity up to high.  His pain is a little more today.    Pertinent History  Dislocation Lt knee at 28 yr old; multiple times(10-14times) last in 2014    Patient Stated Goals  regain mobility and strength of Rt knee and prevent recurrent injury - gain confidence    Currently in Pain?  Yes    Pain Score  3     Pain Location  Knee    Pain Orientation  Right;Medial;Lateral    Pain Descriptors / Indicators  Sore;Dull    Aggravating Factors   end range extension of knee    Pain Relieving Factors  ice, medicine         [facility] PT Assessment - 12/31/18 0001      Assessment   Medical Diagnosis  Rt patella dislocation     Referring Provider (PT)  Dr Benjamin Stainhekkekandam     Onset Date/Surgical Date  11/24/18    Hand Dominance  Left    Prior Therapy  for Lt knee        [facility] Adult PT Treatment/Exercise - 12/31/18 0001      Ambulation/Gait   Ambulation Distance  (Feet)  475 Feet    Assistive device  L Axillary Crutch    Gait Pattern  Step-through pattern;Decreased stance time - right;Decreased dorsiflexion - right;Decreased weight shift to right    Gait velocity  slowed, to work on quality of gait.     Gait Comments  VC for lengthening stride, increasing Rt foot roll through with hip in ext, increase heel strike and less Lt trunk shift.       Knee/Hip Exercises: Stretches   LobbyistQuad Stretch  --   prone   Scientist, research (physical sciences)Gastroc Stretch  Right;2 reps;30 seconds      Knee/Hip Exercises: Aerobic   Recumbent Bike  L2 x 6 min       Knee/Hip Exercises: Standing   Other Standing Knee Exercises  weigth shift laterally x 1-2 min; stagger stance each foot fwd for shift fwd/back focus on knee control and smooth transfer of weight. able to bear 90% weight.       Knee/Hip Exercises: Seated   Long Arc Quad  Right;1 set;10 reps;Weights    Long Arc Coca-ColaQuad Weight  --  2.5#    Long [employer] Limitations  5 sec hold in ext.     Sit to Sand  5 reps;with UE support   slow eccentric control       Vasopneumatic   Number Minutes Vasopneumatic   10 minutes    Vasopnuematic Location   Knee   Rt    Vasopneumatic Pressure  Medium    Vasopneumatic Temperature   34 deg       Manual Therapy   Manual Therapy  Taping;Soft tissue mobilization    Manual therapy comments  I strip of reg rock tape place around med/lateral knee surrounding patella, crossing over joint line - creating a horseshoe shape, to increase proprioception, and decompress tissue.      Soft tissue mobilization  IASTM to Rt distal quad, Rt medial knee and medial patellar tendon                  PT Long Term Goals - 12/29/18 1008      PT LONG TERM GOAL #1   Title  Increase AROM Rt knee to 120 deg flexion 01/25/2019    Time  6    Period  Weeks    Status  Achieved      PT LONG TERM GOAL #2   Title  Increase strength Rt LE to 4+/5 to 5/5 throughout 01/25/2019    Time  6    Period  Weeks    Status  On-going       PT LONG TERM GOAL #3   Title  Ambulate without assistive device with normal gait pattern 01/25/2019    Time  6    Period  Weeks    Status  On-going      PT LONG TERM GOAL #4   Title  Independent in HEP 01/25/2019    Time  6    Period  Weeks    Status  On-going      PT LONG TERM GOAL #5   Title  Improve FOTO to </= 36% limitation 01/25/2019    Time  6    Period  Weeks    Status  On-going            Plan - 12/31/18 1553    Clinical Impression Statement  Pt now able to bear up to 90% of weight into RLE.  continued fear of pain/ knee buckling reported by patient.  Gait quality improving with freq cues during 13ft trials.  Pt has new goal to work on step stool to replace lighting features in house; wont' attempt until more confident with knee.  Pt reported less clicking and pain with bicycle after IASTM to tissue surrounding patella.  Progressing towards goals.    Rehab Potential  Good    PT Frequency  2x / week    PT Duration  6 weeks    PT Treatment/Interventions  Patient/family education;ADLs/Self Care Home Management;Cryotherapy;Electrical Stimulation;Iontophoresis 4mg /ml Dexamethasone;Moist Heat;Ultrasound;Manual techniques;Dry needling;Neuromuscular re-education;Gait training;Stair training;Functional mobility training;Therapeutic activities;Therapeutic exercise;Balance training;Vasopneumatic Device;Taping    PT Next Visit Plan  continue strengthening and gait for Rt knee. Tape and manual therapy as indicated.    PT Home Exercise Plan  [id number]       Patient will benefit from skilled therapeutic intervention in order to improve the following deficits and impairments:     Visit Diagnosis: 1. Acute pain of right knee   2. Muscle weakness (generalized)   3. Stiffness of right knee, not elsewhere classified   4. Other abnormalities  of gait and mobility        Problem List Patient Active Problem List   Diagnosis Date Noted  . Tinea cruris 12/28/2018  . Patellar  dislocation 04/07/2013   Mayer CamelJennifer Carlson-Long, PTA 12/31/18 3:58 PM  [facility] [place] [facility] [address] [place], [place], [address] Phone: [phone]   Fax:  [phone]  Name: Roger Fox MRN: [id number] Date of Birth: 12/16/1990

## 2019-01-04 ENCOUNTER — Other Ambulatory Visit: Payer: Self-pay

## 2019-01-04 ENCOUNTER — Ambulatory Visit (INDEPENDENT_AMBULATORY_CARE_PROVIDER_SITE_OTHER): Payer: BC Managed Care – PPO | Admitting: Physical Therapy

## 2019-01-04 DIAGNOSIS — M6281 Muscle weakness (generalized): Secondary | ICD-10-CM

## 2019-01-04 DIAGNOSIS — M25661 Stiffness of right knee, not elsewhere classified: Secondary | ICD-10-CM

## 2019-01-04 DIAGNOSIS — R2689 Other abnormalities of gait and mobility: Secondary | ICD-10-CM

## 2019-01-04 DIAGNOSIS — M25561 Pain in right knee: Secondary | ICD-10-CM | POA: Diagnosis not present

## 2019-01-04 NOTE — Therapy (Signed)
[place], [place], [address] Phone: [phone]   Fax:  [phone]  Physical Therapy Treatment  Patient Details  Name: Roger Fox MRN: [id number] Date of Birth: [birth year] Referring Provider (PT): Dr Dianah Field    Encounter Date: 01/04/2019  PT End of Session - 01/04/19 1102    Visit Number  7    Number of Visits  12    Date for PT Re-Evaluation  01/25/19    PT Start Time  1021    PT Stop Time  1112    PT Time Calculation (min)  51 min    Activity Tolerance  Patient tolerated treatment well    Behavior During Therapy  [facility] for tasks assessed/performed       Past Medical History:  Diagnosis Date  . No pertinent past medical history     Past Surgical History:  Procedure Laterality Date  . TONSILLECTOMY    . WISDOM TOOTH EXTRACTION      There were no vitals filed for this visit.  Subjective Assessment - 01/04/19 1024    Subjective  Pt reports he has been working on his gait more.  Getting easier to shift weight on to Rt leg. He tried to do some IASTM at home, but not sure entirely how to do it.    Pertinent History  Dislocation Lt knee at 28 yr old; multiple times(10-14times) last in 2014    Patient Stated Goals  regain mobility and strength of Rt knee and prevent recurrent injury - gain confidence    Currently in Pain?  Yes    Pain Score  2     Pain Location  Knee    Pain Orientation  Right;Medial;Lateral    Pain Descriptors / Indicators  Dull;Tightness    Aggravating Factors   end range extension of knee; squatting    Pain Relieving Factors  ice, medicine         [facility] PT Assessment - 01/04/19 0001      Assessment   Medical Diagnosis  Rt patella dislocation     Referring Provider (PT)  Dr Dianah Field     Onset Date/Surgical Date  11/24/18    Hand Dominance  Left    Prior Therapy  for Lt knee        [facility] Adult PT Treatment/Exercise - 01/04/19 0001      Ambulation/Gait   Ambulation Distance (Feet)  400 Feet    Assistive device  Straight cane   progressed to [facility] without touching down.    Gait Pattern  Step-through pattern;Decreased stance time - right;Decreased dorsiflexion - right;Decreased weight shift to right    Gait velocity  slowed, to work on quality of gait.     Gait Comments  VC for smoothing transition over to Lt foot, even weight shift, straight knee for heel strike through stance phase; improved with repetition.       Knee/Hip Exercises: Stretches   Passive Hamstring Stretch  Right;2 reps;30 seconds    Quad Stretch  Right;2 reps;30 seconds    ITB Stretch  Right;2 reps;30 seconds    Other Knee/Hip Stretches  Rt adductor stretch in supine with strap x 20 sec      Knee/Hip Exercises: Aerobic   Recumbent Bike  L2 x 6 min       Knee/Hip Exercises: Standing   Forward Step Up  Right;1 set   5 partial step ups, 3 full   Forward Step Up Limitations  on Step  stool; difficulty descneding backwards.     SLS  3 reps up to 10 sec with intermittent UE support.     Other Standing Knee Exercises  forward weight shifts onto scale - up to 100% WB with intermittent UE support.        Vasopneumatic   Number Minutes Vasopneumatic   10 minutes    Vasopnuematic Location   Knee   Rt    Vasopneumatic Pressure  Medium    Vasopneumatic Temperature   34 deg       Manual Therapy   Manual Therapy  Taping;Soft tissue mobilization    Manual therapy comments  I strip of reg rock tape place around med/lateral knee surrounding patella, crossing over joint line - creating a horseshoe shape, to increase proprioception, and decompress tissue.      Soft tissue mobilization  IASTM to Rt distal quad, Rt medial knee and medial patellar tendon                  PT Long Term Goals - 12/29/18 1008      PT LONG TERM GOAL #1   Title  Increase AROM Rt knee to 120 deg flexion 01/25/2019    Time  6    Period  Weeks    Status  Achieved      PT LONG  TERM GOAL #2   Title  Increase strength Rt LE to 4+/5 to 5/5 throughout 01/25/2019    Time  6    Period  Weeks    Status  On-going      PT LONG TERM GOAL #3   Title  Ambulate without assistive device with normal gait pattern 01/25/2019    Time  6    Period  Weeks    Status  On-going      PT LONG TERM GOAL #4   Title  Independent in HEP 01/25/2019    Time  6    Period  Weeks    Status  On-going      PT LONG TERM GOAL #5   Title  Improve FOTO to </= 36% limitation 01/25/2019    Time  6    Period  Weeks    Status  On-going            Plan - 01/04/19 1311    Clinical Impression Statement  Much improved gait quality with 40 ft trials.  Pt hesistant to walk without AD for fear of buckling.  Pt able to bear 100% wt on RLE and balance for 3-5 sec.  Pt continues to report less clicking in knee when IASTM is performed on Rt thigh just prior to bicycle.  Pt had difficulty allowing Rt knee to bend when ascending step backwards.  Pt progressing towards goals.    PT Frequency  2x / week    PT Duration  6 weeks    PT Treatment/Interventions  Patient/family education;ADLs/Self Care Home Management;Cryotherapy;Electrical Stimulation;Iontophoresis 4mg /ml Dexamethasone;Moist Heat;Ultrasound;Manual techniques;Dry needling;Neuromuscular re-education;Gait training;Stair training;Functional mobility training;Therapeutic activities;Therapeutic exercise;Balance training;Vasopneumatic Device;Taping    PT Next Visit Plan  continue strengthening and gait for Rt knee. Tape and manual therapy as indicated.    PT Home Exercise Plan  [id number]    Consulted and Agree with Plan of Care  Patient       Patient will benefit from skilled therapeutic intervention in order to improve the following deficits and impairments:  Pain, Increased fascial restricitons, Increased muscle spasms, Hypermobility, Decreased strength, Decreased range of motion, Decreased mobility, Abnormal  gait, Decreased activity tolerance  Visit  Diagnosis: 1. Acute pain of right knee   2. Stiffness of right knee, not elsewhere classified   3. Muscle weakness (generalized)   4. Other abnormalities of gait and mobility        Problem List Patient Active Problem List   Diagnosis Date Noted  . Tinea cruris 12/28/2018  . Patellar dislocation 04/07/2013   Mayer CamelJennifer Carlson-Long, PTA 01/04/19 1:15 PM  [facility] [address] [place], [place], [address] Phone: [phone]   Fax:  [phone]  Name: Roger Fox MRN: [id number] Date of Birth: 06/03/1990

## 2019-01-08 ENCOUNTER — Other Ambulatory Visit: Payer: Self-pay

## 2019-01-08 ENCOUNTER — Ambulatory Visit (INDEPENDENT_AMBULATORY_CARE_PROVIDER_SITE_OTHER): Payer: BC Managed Care – PPO | Admitting: Physical Therapy

## 2019-01-08 DIAGNOSIS — R2689 Other abnormalities of gait and mobility: Secondary | ICD-10-CM | POA: Diagnosis not present

## 2019-01-08 DIAGNOSIS — M6281 Muscle weakness (generalized): Secondary | ICD-10-CM

## 2019-01-08 DIAGNOSIS — M25661 Stiffness of right knee, not elsewhere classified: Secondary | ICD-10-CM | POA: Diagnosis not present

## 2019-01-08 DIAGNOSIS — M25561 Pain in right knee: Secondary | ICD-10-CM | POA: Diagnosis not present

## 2019-01-08 NOTE — Therapy (Addendum)
[place], [place], [address] Phone: [phone]   Fax:  [phone]  Physical Therapy Treatment  Patient Details  Name: Roger Fox MRN: [id number] Date of Birth: May 30, 1990 Referring Provider (PT): Dr Dianah Field    Encounter Date: 01/08/2019  PT End of Session - 01/08/19 1012    Visit Number  8    Number of Visits  12    Date for PT Re-Evaluation  01/25/19    PT Start Time  1000    PT Stop Time  1044    PT Time Calculation (min)  44 min    Activity Tolerance  Patient tolerated treatment well    Behavior During Therapy  [facility] for tasks assessed/performed       Past Medical History:  Diagnosis Date  . No pertinent past medical history     Past Surgical History:  Procedure Laterality Date  . TONSILLECTOMY    . WISDOM TOOTH EXTRACTION      There were no vitals filed for this visit.  Subjective Assessment - 01/08/19 1012    Subjective  Pt arrives without any crutches, "this is the first time I've been outside the house without a crutch". He is walking around the house without the crutch now. Pt reports stairs are still difficult.    Patient Stated Goals  regain mobility and strength of Rt knee and prevent recurrent injury - gain confidence    Currently in Pain?  No/denies    Pain Score  0-No pain         [facility] PT Assessment - 01/08/19 0001      Assessment   Medical Diagnosis  Rt patella dislocation     Referring Provider (PT)  Dr Dianah Field     Onset Date/Surgical Date  11/24/18    Hand Dominance  Left    Next MD Visit  02/08/19    Prior Therapy  for Lt knee       Strength   Right Hip Flexion  5/5    Right Hip Extension  5/5    Right Hip ABduction  5/5      [facility] Adult PT Treatment/Exercise - 01/08/19 0001      Knee/Hip Exercises: Stretches   Passive Hamstring Stretch  Right;2 reps;30 seconds;Left    Quad Stretch  Right;2 reps;30 seconds    ITB Stretch  Right;2 reps;30  seconds;Left    Gastroc Stretch  Right;3 reps;20 seconds    Other Knee/Hip Stretches  Rt adductor stretch in supine with strap x 20 sec      Knee/Hip Exercises: Aerobic   Recumbent Bike  L2 x 6 min     Other Aerobic  1 lap around gym 2x in between exercises to decrease stiffness in knee      Knee/Hip Exercises: Standing   Heel Raises  Both;2 sets;10 reps   2nd set with toe raises    Hip Abduction  Stengthening;Left;Right;1 set;10 reps    Hip Extension  Stengthening;Right;Left;1 set;10 reps    Forward Step Up  Right;2 sets;10 reps;Hand Hold: 2   3" step   Forward Step Up Limitations  1 set of 8 reps on 6" step     SLS  3 reps up to 10 sec with intermittent UE support.       Knee/Hip Exercises: Seated   Sit to Sand  5 reps   Rt foot back, Lt foot forward      Modalities   Modalities  --  declined; had time constraints     Manual Therapy   Manual therapy comments  I strip of reg rock tape place around med knee surrounding patella,, to increase proprioception, and decompress tissue.      Soft tissue mobilization  IASTM to Rt distal quad, Rt medial knee and medial patellar tendon             PT Education - 01/08/19 1229    Education Details  HEP    Person(s) Educated  Patient    Methods  Explanation;Demonstration   pt declined handout, may get at later time.   Comprehension  Verbalized understanding;Returned demonstration          PT Long Term Goals - 01/08/19 1059      PT LONG TERM GOAL #1   Title  Increase AROM Rt knee to 120 deg flexion 01/25/2019    Time  6    Period  Weeks    Status  Achieved      PT LONG TERM GOAL #2   Title  Increase strength Rt LE to 4+/5 to 5/5 throughout 01/25/2019    Time  6    Period  Weeks    Status  Achieved      PT LONG TERM GOAL #3   Title  Ambulate without assistive device with normal gait pattern 01/25/2019    Time  6    Period  Weeks    Status  Partially Met      PT LONG TERM GOAL #4   Title  Independent in HEP  01/25/2019    Time  6    Period  Weeks    Status  On-going      PT LONG TERM GOAL #5   Title  Improve FOTO to </= 36% limitation 01/25/2019    Time  6    Period  Weeks    Status  On-going            Plan - 01/08/19 1015    Clinical Impression Statement  Pt now ambulating without AD.  He reported there was no clicking in his knee with the bike today, like in previous sessions.  Pt continues with difficulty and pain in Rt knee with ascending/descending stairs. Pain resolved with rest in between exercises.  Also challenged with SLS on Rt foot.  Pt has met LTG #2.  Progressing towards remaining goals.    Rehab Potential  Good    PT Frequency  2x / week    PT Duration  6 weeks    PT Treatment/Interventions  Patient/family education;ADLs/Self Care Home Management;Cryotherapy;Electrical Stimulation;Iontophoresis 40m/ml Dexamethasone;Moist Heat;Ultrasound;Manual techniques;Dry needling;Neuromuscular re-education;Gait training;Stair training;Functional mobility training;Therapeutic activities;Therapeutic exercise;Balance training;Vasopneumatic Device;Taping    PT Next Visit Plan  continue strengthening for Rt knee.    PT Home Exercise Plan  [email or web address]    Consulted and Agree with Plan of Care  Patient       Patient will benefit from skilled therapeutic intervention in order to improve the following deficits and impairments:  Pain, Increased fascial restricitons, Increased muscle spasms, Hypermobility, Decreased strength, Decreased range of motion, Decreased mobility, Abnormal gait, Decreased activity tolerance  Visit Diagnosis: 1. Acute pain of right knee   2. Stiffness of right knee, not elsewhere classified   3. Muscle weakness (generalized)   4. Other abnormalities of gait and mobility        Problem List Patient Active Problem List   Diagnosis Date Noted  . Tinea cruris 12/28/2018  . Patellar dislocation 04/07/2013  Kerin Perna, PTA 01/08/19 12:30 PM  [facility] [place], [place], [address] Phone: [phone]   Fax:  [phone]  Name: Roger Fox MRN: [id number] Date of Birth: 06/23/1990

## 2019-01-08 NOTE — Patient Instructions (Signed)
Access Code: [email or web address]  URL: [email or web address]  Date: 01/08/2019  Prepared by: Kerin Perna   Exercises  Supine Hamstring Stretch with Strap - 3 reps - 1 sets - 30 seconds hold - 2x daily - 7x weekly  Prone Quadriceps Stretch with Strap - 3 reps - 1 sets - 30 seconds hold - 1x daily - 7x weekly  Standing Hip Abduction - 10 reps - 2-3 sets - 1x daily - 7x weekly  Standing Hip Extension - 10 reps - 2-3 sets - 1x daily - 7x weekly  Standing Heel Raise with Support - 10 reps - 2 sets - 1x daily - 7x weekly  Supine Bridge with Mini Swiss Ball Between Knees - 10 reps - 1-2 sets - 5 sec hold - 1x daily - 7x weekly  Step Up - 10 reps - 1 sets - 1-2x daily - 7x weekly

## 2019-01-11 ENCOUNTER — Ambulatory Visit (INDEPENDENT_AMBULATORY_CARE_PROVIDER_SITE_OTHER): Payer: BC Managed Care – PPO | Admitting: Rehabilitative and Restorative Service Providers"

## 2019-01-11 ENCOUNTER — Encounter: Payer: Self-pay | Admitting: Rehabilitative and Restorative Service Providers"

## 2019-01-11 DIAGNOSIS — R2689 Other abnormalities of gait and mobility: Secondary | ICD-10-CM

## 2019-01-11 DIAGNOSIS — M25661 Stiffness of right knee, not elsewhere classified: Secondary | ICD-10-CM | POA: Diagnosis not present

## 2019-01-11 DIAGNOSIS — M6281 Muscle weakness (generalized): Secondary | ICD-10-CM | POA: Diagnosis not present

## 2019-01-11 DIAGNOSIS — M25561 Pain in right knee: Secondary | ICD-10-CM

## 2019-01-11 NOTE — Therapy (Signed)
[place], [place], [address] Phone: [phone]   Fax:  [phone]  Physical Therapy Treatment  Patient Details  Name: Roger Fox MRN: [id number] Date of Birth: 1991/05/12 Referring Provider (PT): Dr Dianah Field    Encounter Date: 01/11/2019  PT End of Session - 01/11/19 1527    Visit Number  9    Number of Visits  12    Date for PT Re-Evaluation  01/25/19    PT Start Time  0160    PT Stop Time  1602    PT Time Calculation (min)  41 min    Activity Tolerance  Patient tolerated treatment well       Past Medical History:  Diagnosis Date  . No pertinent past medical history     Past Surgical History:  Procedure Laterality Date  . TONSILLECTOMY    . WISDOM TOOTH EXTRACTION      There were no vitals filed for this visit.  Subjective Assessment - 01/11/19 1529    Subjective  Patient reports that he continues to have some pain in the Rt knee at times. He feels weak with stairs. some aching today - walking around more    Currently in Pain?  Yes    Pain Score  2     Pain Location  Knee    Pain Orientation  Right    Pain Descriptors / Indicators  Aching                       [facility] Adult PT Treatment/Exercise - 01/11/19 0001      Knee/Hip Exercises: Stretches   Passive Hamstring Stretch  Right;2 reps;30 seconds;Left    Quad Stretch  Right;1 rep;30 seconds   supine with strap - heel to buttock    ITB Stretch  Right;2 reps;30 seconds;Left    Gastroc Stretch  Right;3 reps;20 seconds    Other Knee/Hip Stretches  Rt adductor stretch in supine with strap x 20 sec      Knee/Hip Exercises: Aerobic   Elliptical  L2 x 5 min       Knee/Hip Exercises: Standing   Forward Step Up  Right;10 reps;Hand Hold: 2;Step Height: 6"   slow up and down    Other Standing Knee Exercises  side steps 10 feet x 2 each direction       Knee/Hip Exercises: Seated   Sit to Sand  10 reps   slow  eccentric lowering stand to sit      Vasopneumatic   Number Minutes Vasopneumatic   15 minutes    Vasopnuematic Location   Knee   RT    Vasopneumatic Pressure  Medium    Vasopneumatic Temperature   34 deg       Manual Therapy   Manual therapy comments  2 strips of rock tape posterior knee to cue pt to avoid hyperextension of knees in standing and walking                   PT Long Term Goals - 01/08/19 1059      PT LONG TERM GOAL #1   Title  Increase AROM Rt knee to 120 deg flexion 01/25/2019    Time  6    Period  Weeks    Status  Achieved      PT LONG TERM GOAL #2   Title  Increase strength Rt LE to 4+/5 to 5/5 throughout 01/25/2019    Time  6    Period  Weeks    Status  Achieved      PT LONG TERM GOAL #3   Title  Ambulate without assistive device with normal gait pattern 01/25/2019    Time  6    Period  Weeks    Status  Partially Met      PT LONG TERM GOAL #4   Title  Independent in HEP 01/25/2019    Time  6    Period  Weeks    Status  On-going      PT LONG TERM GOAL #5   Title  Improve FOTO to </= 36% limitation 01/25/2019    Time  6    Period  Weeks    Status  On-going            Plan - 01/11/19 1528    Clinical Impression Statement  Ambulates without assistive device - continues to wear Rt knee brace. Gaining functional strength for ADL's and with exercises. Stands and walks with hyperextension of knees bilat. Worked on control in standing and added trial of taping to cue pt to avoid hyperextension in standing. Will benefit from continued PT to address weakness and instability    Rehab Potential  Good    PT Frequency  2x / week    PT Duration  6 weeks    PT Treatment/Interventions  Patient/family education;ADLs/Self Care Home Management;Cryotherapy;Electrical Stimulation;Iontophoresis 17m/ml Dexamethasone;Moist Heat;Ultrasound;Manual techniques;Dry needling;Neuromuscular re-education;Gait training;Stair training;Functional mobility  training;Therapeutic activities;Therapeutic exercise;Balance training;Vasopneumatic Device;Taping    PT Next Visit Plan  continue strengthening for Rt knee; work on avoiding hyperextension of knees in standing and with gait; assess response to tape    PT Home Exercise Plan  [id number]    Consulted and Agree with Plan of Care  Patient       Patient will benefit from skilled therapeutic intervention in order to improve the following deficits and impairments:  Pain, Increased fascial restricitons, Increased muscle spasms, Hypermobility, Decreased strength, Decreased range of motion, Decreased mobility, Abnormal gait, Decreased activity tolerance  Visit Diagnosis: 1. Acute pain of right knee   2. Stiffness of right knee, not elsewhere classified   3. Muscle weakness (generalized)   4. Other abnormalities of gait and mobility        Problem List Patient Active Problem List   Diagnosis Date Noted  . Tinea cruris 12/28/2018  . Patellar dislocation 04/07/2013    Kynzli Rease PNilda SimmerPT, MPH  01/11/2019, 4:00 PM  [facility] [address]Phone: [phone]  Fax:  [phone] Name: Roger AdelsonMRN: [id number] of Birth: [birth year]

## 2019-01-14 ENCOUNTER — Ambulatory Visit (INDEPENDENT_AMBULATORY_CARE_PROVIDER_SITE_OTHER): Payer: BC Managed Care – PPO | Admitting: Physical Therapy

## 2019-01-14 ENCOUNTER — Other Ambulatory Visit: Payer: Self-pay

## 2019-01-14 DIAGNOSIS — M25661 Stiffness of right knee, not elsewhere classified: Secondary | ICD-10-CM | POA: Diagnosis not present

## 2019-01-14 DIAGNOSIS — M6281 Muscle weakness (generalized): Secondary | ICD-10-CM

## 2019-01-14 DIAGNOSIS — M25561 Pain in right knee: Secondary | ICD-10-CM | POA: Diagnosis not present

## 2019-01-14 NOTE — Therapy (Signed)
[place], [place], [address] Phone: [phone]   Fax:  [phone]  Physical Therapy Treatment  Patient Details  Name: Roger Fox MRN: [id number] Date of Birth: [birth year] Referring Provider (PT): Dr Dianah Field    Encounter Date: 01/14/2019  PT End of Session - 01/14/19 1747    Visit Number  10    Number of Visits  12    Date for PT Re-Evaluation  01/25/19    PT Start Time  9417    PT Stop Time  1747    PT Time Calculation (min)  49 min    Activity Tolerance  Patient tolerated treatment well    Behavior During Therapy  [facility] for tasks assessed/performed       Past Medical History:  Diagnosis Date  . No pertinent past medical history     Past Surgical History:  Procedure Laterality Date  . TONSILLECTOMY    . WISDOM TOOTH EXTRACTION      There were no vitals filed for this visit.  Subjective Assessment - 01/14/19 1701    Subjective  Pt reports he did some weeding yesterday, so he is more sore today.  He believes the tape helped his awareness of hyperextending, doesn't want it reapplied (painful removal).    Patient Stated Goals  regain mobility and strength of Rt knee and prevent recurrent injury - gain confidence    Currently in Pain?  Yes    Pain Score  4     Pain Location  Knee    Pain Orientation  Right;Medial    Pain Descriptors / Indicators  Aching    Aggravating Factors   end range extension of knee, squatting    Pain Relieving Factors  ice         [facility] PT Assessment - 01/14/19 0001      Assessment   Medical Diagnosis  Rt patella dislocation     Referring Provider (PT)  Dr Dianah Field     Onset Date/Surgical Date  11/24/18    Hand Dominance  Left    Next MD Visit  02/08/19    Prior Therapy  for Lt knee        [facility] Adult PT Treatment/Exercise - 01/14/19 0001      Knee/Hip Exercises: Stretches   Passive Hamstring Stretch  Right;2 reps;30 seconds   seated    Quad Stretch  Right;Left;2 reps;30 seconds   standing, UE support on rail.    ITB Stretch Limitations  one rep of legs crossed forward bend    Gastroc Stretch  Both;2 reps;30 seconds   incline board     Knee/Hip Exercises: Aerobic   Elliptical  L3: 5 min     Other Aerobic  1 lap around gym 2x in between exercises to decrease stiffness in knee      Knee/Hip Exercises: Standing   Heel Raises  Both;1 set;10 reps   heels off step.    Heel Raises Limitations  repeated with RLE only x 10 on floor.     Lateral Step Up  Right;1 set;10 reps;Step Height: 6";Hand Hold: 1    Lateral Step Up Limitations  trial on bosu x 2 reps, too difficult - switched to standard step    SLS  3 reps on bosu x 15 sec with intermittent support to steady.   Forward leans to touch chair x 4 on LLE, 8 on RLE     SLS with Vectors  SLS with toe tap front,  side, back x 5 reps - trial with mini squat, fatigued quickly.     Other Standing Knee Exercises  --    Other Standing Knee Exercises  side stepping 10 feet x 4 each direction with focus on control of motion and height of step (increased time in each SLS during motion)      Vasopneumatic   Number Minutes Vasopneumatic   10 minutes    Vasopnuematic Location   Knee   RT    Vasopneumatic Pressure  Medium    Vasopneumatic Temperature   34 deg       Manual Therapy   Soft tissue mobilization  IASTM to Rt distal quad, Rt medial knee and medial patellar tendon         PT Long Term Goals - 01/08/19 1059      PT LONG TERM GOAL #1   Title  Increase AROM Rt knee to 120 deg flexion 01/25/2019    Time  6    Period  Weeks    Status  Achieved      PT LONG TERM GOAL #2   Title  Increase strength Rt LE to 4+/5 to 5/5 throughout 01/25/2019    Time  6    Period  Weeks    Status  Achieved      PT LONG TERM GOAL #3   Title  Ambulate without assistive device with normal gait pattern 01/25/2019    Time  6    Period  Weeks    Status  Partially Met      PT LONG TERM GOAL #4    Title  Independent in HEP 01/25/2019    Time  6    Period  Weeks    Status  On-going      PT LONG TERM GOAL #5   Title  Improve FOTO to </= 36% limitation 01/25/2019    Time  6    Period  Weeks    Status  On-going            Plan - 01/14/19 1748    Clinical Impression Statement  Pt's gait pattern much improved from previous sessions.  continued focus on balance and strengthening for Rt knee.  Pt fatigues quickly with terminal knee ext exercises (like mini squat in SLS).  Progressing well towards remaining goals.    Rehab Potential  Good    PT Frequency  2x / week    PT Duration  6 weeks    PT Treatment/Interventions  Patient/family education;ADLs/Self Care Home Management;Cryotherapy;Electrical Stimulation;Iontophoresis 77m/ml Dexamethasone;Moist Heat;Ultrasound;Manual techniques;Dry needling;Neuromuscular re-education;Gait training;Stair training;Functional mobility training;Therapeutic activities;Therapeutic exercise;Balance training;Vasopneumatic Device;Taping    PT Next Visit Plan  continue strengthening for Rt knee; work on avoiding hyperextension of knees in standing and with gait    PT Home Exercise Plan  [email or web address]    Consulted and Agree with Plan of Care  Patient       Patient will benefit from skilled therapeutic intervention in order to improve the following deficits and impairments:  Pain, Increased fascial restricitons, Increased muscle spasms, Hypermobility, Decreased strength, Decreased range of motion, Decreased mobility, Abnormal gait, Decreased activity tolerance  Visit Diagnosis: Acute pain of right knee  Stiffness of right knee, not elsewhere classified  Muscle weakness (generalized)     Problem List Patient Active Problem List   Diagnosis Date Noted  . Tinea cruris 12/28/2018  . Patellar dislocation 04/07/2013   JKerin Perna PTA 01/14/19 5:55 PM  [facility] [address] [place], [place], [address] Phone: [phone]   Fax:  [phone]  Name: Roger Fox MRN: [id number] Date of Birth: October 27, 1990

## 2019-01-18 ENCOUNTER — Encounter: Payer: Self-pay | Admitting: Sports Medicine

## 2019-01-20 ENCOUNTER — Other Ambulatory Visit: Payer: Self-pay

## 2019-01-20 ENCOUNTER — Ambulatory Visit (INDEPENDENT_AMBULATORY_CARE_PROVIDER_SITE_OTHER): Payer: BC Managed Care – PPO | Admitting: Physical Therapy

## 2019-01-20 ENCOUNTER — Encounter: Payer: Self-pay | Admitting: Physical Therapy

## 2019-01-20 DIAGNOSIS — M6281 Muscle weakness (generalized): Secondary | ICD-10-CM | POA: Diagnosis not present

## 2019-01-20 DIAGNOSIS — M25661 Stiffness of right knee, not elsewhere classified: Secondary | ICD-10-CM

## 2019-01-20 DIAGNOSIS — R2689 Other abnormalities of gait and mobility: Secondary | ICD-10-CM

## 2019-01-20 DIAGNOSIS — M25561 Pain in right knee: Secondary | ICD-10-CM

## 2019-01-20 DIAGNOSIS — L304 Erythema intertrigo: Secondary | ICD-10-CM | POA: Diagnosis not present

## 2019-01-20 NOTE — Therapy (Addendum)
[place], [place], [address] Phone: [phone]   Fax:  [phone]  Physical Therapy Treatment  Patient Details  Name: Roger Fox MRN: [id number] Date of Birth: 1990-10-24 Referring Provider (PT): Dr Dianah Field    Encounter Date: 01/20/2019  PT End of Session - 01/20/19 0851    Visit Number  11    Number of Visits  12    Date for PT Re-Evaluation  01/25/19    PT Start Time  0845    PT Stop Time  0925    PT Time Calculation (min)  40 min    Activity Tolerance  Patient tolerated treatment well;No increased pain       Past Medical History:  Diagnosis Date  . No pertinent past medical history     Past Surgical History:  Procedure Laterality Date  . TONSILLECTOMY    . WISDOM TOOTH EXTRACTION      There were no vitals filed for this visit.  Subjective Assessment - 01/20/19 0852    Subjective  Pt reports he is now able to walk up stairs without holding on; going down stairs is still challenging.  His confidence is improving; he's walking without brace when at home.    Pertinent History  Dislocation Lt knee at 28 yr old; multiple times(10-14times) last in 2014    Patient Stated Goals  regain mobility and strength of Rt knee and prevent recurrent injury - gain confidence    Currently in Pain?  No/denies    Pain Score  0-No pain         [facility] PT Assessment - 01/20/19 0001      Assessment   Medical Diagnosis  Rt patella dislocation     Referring Provider (PT)  Dr Dianah Field     Onset Date/Surgical Date  11/24/18    Hand Dominance  Left    Next MD Visit  02/08/19    Prior Therapy  for Lt knee       Observation/Other Assessments   Focus on Therapeutic Outcomes (FOTO)   30% limitation         [facility] Adult PT Treatment/Exercise - 01/20/19 0001      Knee/Hip Exercises: Stretches   Active Hamstring Stretch  --    Passive Hamstring Stretch  Right;Left;2 reps;20 seconds    Quad  Stretch  Right;Left;2 reps;30 seconds   standing, UE support on rail.    Gastroc Stretch  Both;1 rep;60 seconds      Knee/Hip Exercises: Aerobic   Elliptical  L3:  2 min forward, L1 3 min backward       Knee/Hip Exercises: Standing   Forward Step Up  Right;1 set;5 reps   onto Bosu, slowly up/down, intermittent UE support on bars   Step Down  Left;1 set;5 reps;Step Height: 6";Hand Hold: 0   retro step up RLE   SLS  SLS on Bosu (Rt/Lt) x 20 sec; SLS with forward leans to touch chair seat x 5 each leg.     Other Standing Knee Exercises  Fitter with black/blue bands x 2 min with UE support;  forward reciprocal pattern step up on step-stool (3 steps) x 2 reps; side stepping over cones with slow deliberate SLS bwtn steps x 10 ft x 6 reps;  Squats to pick up cones x 3 reps        Modalities   Modalities  --   pt declined  PT Long Term Goals - 01/20/19 0935      PT LONG TERM GOAL #1   Title  Increase AROM Rt knee to 120 deg flexion 01/25/2019    Time  6    Period  Weeks    Status  Achieved      PT LONG TERM GOAL #2   Title  Increase strength Rt LE to 4+/5 to 5/5 throughout 01/25/2019    Time  6    Period  Weeks    Status  Achieved      PT LONG TERM GOAL #3   Title  Ambulate without assistive device with normal gait pattern 01/25/2019    Time  6    Period  Weeks    Status  Achieved      PT LONG TERM GOAL #4   Title  Independent in HEP 01/25/2019    Time  6    Period  Weeks    Status  On-going      PT LONG TERM GOAL #5   Title  Improve FOTO to </= 36% limitation 01/25/2019    Time  6    Period  Weeks    Status  Achieved            Plan - 01/20/19 1314    Clinical Impression Statement  Pt tolerated new, more challenging exercises without difficulty and with minimal pain. Pt has made great progress over last 2 wks, with improved gait pattern and ability to ascend/descend stairs with less pain and difficulty. Pt has met majority of his goals and is  agreeable to hold 30 days while he continues to work on [employer].    Rehab Potential  Good    PT Frequency  2x / week    PT Duration  6 weeks    PT Treatment/Interventions  Patient/family education;ADLs/Self Care Home Management;Cryotherapy;Electrical Stimulation;Iontophoresis 57m/ml Dexamethasone;Moist Heat;Ultrasound;Manual techniques;Dry needling;Neuromuscular re-education;Gait training;Stair training;Functional mobility training;Therapeutic activities;Therapeutic exercise;Balance training;Vasopneumatic Device;Taping    PT Next Visit Plan  spoke to supervising PT; will hold 30 days; if pt doesn't return by 02/20/19, will d/c.    PT Home Exercise Plan  [id number]    Consulted and Agree with Plan of Care  Patient       Patient will benefit from skilled therapeutic intervention in order to improve the following deficits and impairments:  Pain, Increased fascial restricitons, Increased muscle spasms, Hypermobility, Decreased strength, Decreased range of motion, Decreased mobility, Abnormal gait, Decreased activity tolerance  Visit Diagnosis: Acute pain of right knee  Stiffness of right knee, not elsewhere classified  Muscle weakness (generalized)  Other abnormalities of gait and mobility     Problem List Patient Active Problem List   Diagnosis Date Noted  . Tinea cruris 12/28/2018  . Patellar dislocation 04/07/2013   JKerin Perna PTA 01/20/19 1:19 PM  [facility] [place] [address] [address]Phone: [phone]  Fax:  [phone] Name: Roger MelucciMRN: [id number] of Birth: 505/07/1990 PHYSICAL THERAPY DISCHARGE SUMMARY  Visits from Start of Care: 11  Current functional level related to goals / functional outcomes: See progress note for discharge status    Remaining deficits: Needs to continue with strengthening program    Education / Equipment: HEP Plan: Patient agrees to discharge.   Patient goals were met. Patient is being discharged due to meeting the stated rehab goals.  ?????    Celyn P. HHelene KelpPT, MPH 07/16/19 10:24 AM

## 2019-01-22 ENCOUNTER — Encounter: Payer: BC Managed Care – PPO | Admitting: Physical Therapy

## 2019-02-08 ENCOUNTER — Encounter: Payer: Self-pay | Admitting: Sports Medicine

## 2019-02-08 ENCOUNTER — Ambulatory Visit (INDEPENDENT_AMBULATORY_CARE_PROVIDER_SITE_OTHER): Payer: BC Managed Care – PPO | Admitting: Sports Medicine

## 2019-02-08 ENCOUNTER — Other Ambulatory Visit: Payer: Self-pay

## 2019-02-08 DIAGNOSIS — B356 Tinea cruris: Secondary | ICD-10-CM | POA: Diagnosis not present

## 2019-02-08 DIAGNOSIS — S83004D Unspecified dislocation of right patella, subsequent encounter: Secondary | ICD-10-CM

## 2019-02-08 NOTE — Assessment & Plan Note (Signed)
Overall doing really well, no further dislocations, no pain, he will continue the rehab exercises indefinitely. If he re-dislocates we will consider referral for arthroscopy

## 2019-02-08 NOTE — Assessment & Plan Note (Signed)
Did not improve as much with fluconazole and Lotrisone as well as barrier cream. He went to a dermatologist who prescribed oral Lamisil and topical clotrimazole/triamcinolone. This also did not work, he will follow-up with a dermatologist.

## 2019-02-08 NOTE — Progress Notes (Signed)
Subjective:    CC: Follow-up  HPI: [name] returns, he is a pleasant 28 year old male, he dislocated his right patella, we treated him conservatively with aggressive physical therapy and he returns today pain-free.  He still does not have full trust in the knee but he is gaining it.  I reviewed the past medical history, family history, social history, surgical history, and allergies today and no changes were needed.  Please see the problem list section below in epic for further details.  Past Medical History: Past Medical History:  Diagnosis Date  . No pertinent past medical history    Past Surgical History: Past Surgical History:  Procedure Laterality Date  . TONSILLECTOMY    . WISDOM TOOTH EXTRACTION     Social History: Social History   Socioeconomic History  . Marital status: Married    Spouse name: Not on file  . Number of children: Not on file  . Years of education: Not on file  . Highest education level: Not on file  Occupational History  . Not on file  Social Needs  . Financial resource strain: Not on file  . Food insecurity    Worry: Not on file    Inability: Not on file  . Transportation needs    Medical: Not on file    Non-medical: Not on file  Tobacco Use  . Smoking status: Former Games developermoker  . Smokeless tobacco: Never Used  Substance and Sexual Activity  . Alcohol use: Yes  . Drug use: No  . Sexual activity: Not on file  Lifestyle  . Physical activity    Days per week: Not on file    Minutes per session: Not on file  . Stress: Not on file  Relationships  . Social Musicianconnections    Talks on phone: Not on file    Gets together: Not on file    Attends religious service: Not on file    Active member of club or organization: Not on file    Attends meetings of clubs or organizations: Not on file    Relationship status: Not on file  Other Topics Concern  . Not on file  Social History Narrative  . Not on file   Family History: Family History  Problem  Relation Age of Onset  . Diabetes Other   . Stroke Other   . Cancer Maternal Grandfather        colon   Allergies: Allergies  Allergen Reactions  . Sulfur    Medications: See med rec.  Review of Systems: No fevers, chills, night sweats, weight loss, chest pain, or shortness of breath.   Objective:    General: Well Developed, well nourished, and in no acute distress.  Neuro: Alert and oriented x3, extra-ocular muscles intact, sensation grossly intact.  HEENT: Normocephalic, atraumatic, pupils equal round reactive to light, neck supple, no masses, no lymphadenopathy, thyroid nonpalpable.  Skin: Warm and dry, no rashes. Cardiac: Regular rate and rhythm, no murmurs rubs or gallops, no lower extremity edema.  Respiratory: Clear to auscultation bilaterally. Not using accessory muscles, speaking in full sentences. Right knee: Normal to inspection with no erythema or effusion or obvious bony abnormalities. Palpation normal with no warmth or joint line tenderness or patellar tenderness or condyle tenderness. ROM normal in flexion and extension and lower leg rotation. Ligaments with solid consistent endpoints including ACL, PCL, LCL, MCL. Negative Mcmurray's and provocative meniscal tests. Non painful patellar compression. Patellar and quadriceps tendons unremarkable. Hamstring and quadriceps strength is normal. Negative patellar  apprehension sign today.  Impression and Recommendations:    Patellar dislocation Overall doing really well, no further dislocations, no pain, he will continue the rehab exercises indefinitely. If he re-dislocates we will consider referral for arthroscopy  Tinea cruris Did not improve as much with fluconazole and Lotrisone as well as barrier cream. He went to a dermatologist who prescribed oral Lamisil and topical clotrimazole/triamcinolone. This also did not work, he will follow-up with a dermatologist.   ___________________________________________  Gwen Her. Dianah Field, M.D., ABFM., CAQSM. Primary Care and Sports Medicine [facility]  Adjunct Professor of [employer]

## 2019-03-03 DIAGNOSIS — L304 Erythema intertrigo: Secondary | ICD-10-CM | POA: Diagnosis not present

## 2019-04-21 DIAGNOSIS — Z20828 Contact with and (suspected) exposure to other viral communicable diseases: Secondary | ICD-10-CM | POA: Diagnosis not present

## 2019-04-21 DIAGNOSIS — U071 COVID-19: Secondary | ICD-10-CM | POA: Diagnosis not present

## 2019-05-12 DIAGNOSIS — L301 Dyshidrosis [pompholyx]: Secondary | ICD-10-CM | POA: Diagnosis not present

## 2019-05-12 DIAGNOSIS — L304 Erythema intertrigo: Secondary | ICD-10-CM | POA: Diagnosis not present

## 2019-08-04 DIAGNOSIS — L304 Erythema intertrigo: Secondary | ICD-10-CM | POA: Diagnosis not present

## 2019-08-18 DIAGNOSIS — L304 Erythema intertrigo: Secondary | ICD-10-CM | POA: Diagnosis not present

## 2019-08-25 DIAGNOSIS — R52 Pain, unspecified: Secondary | ICD-10-CM | POA: Diagnosis not present

## 2019-08-25 DIAGNOSIS — L280 Lichen simplex chronicus: Secondary | ICD-10-CM | POA: Diagnosis not present

## 2019-09-22 DIAGNOSIS — L509 Urticaria, unspecified: Secondary | ICD-10-CM | POA: Diagnosis not present

## 2019-09-22 DIAGNOSIS — L280 Lichen simplex chronicus: Secondary | ICD-10-CM | POA: Diagnosis not present

## 2019-10-06 DIAGNOSIS — L209 Atopic dermatitis, unspecified: Secondary | ICD-10-CM | POA: Diagnosis not present

## 2019-10-06 DIAGNOSIS — R52 Pain, unspecified: Secondary | ICD-10-CM | POA: Diagnosis not present

## 2019-10-06 DIAGNOSIS — L738 Other specified follicular disorders: Secondary | ICD-10-CM | POA: Diagnosis not present

## 2019-10-06 DIAGNOSIS — L303 Infective dermatitis: Secondary | ICD-10-CM | POA: Diagnosis not present

## 2019-10-13 DIAGNOSIS — F419 Anxiety disorder, unspecified: Secondary | ICD-10-CM | POA: Diagnosis not present

## 2019-10-13 DIAGNOSIS — Z0000 Encounter for general adult medical examination without abnormal findings: Secondary | ICD-10-CM | POA: Diagnosis not present

## 2019-10-13 DIAGNOSIS — R21 Rash and other nonspecific skin eruption: Secondary | ICD-10-CM | POA: Diagnosis not present

## 2020-03-07 DIAGNOSIS — Z23 Encounter for immunization: Secondary | ICD-10-CM | POA: Diagnosis not present

## 2020-03-07 DIAGNOSIS — R5383 Other fatigue: Secondary | ICD-10-CM | POA: Diagnosis not present

## 2020-03-07 DIAGNOSIS — R635 Abnormal weight gain: Secondary | ICD-10-CM | POA: Diagnosis not present

## 2020-03-07 DIAGNOSIS — F419 Anxiety disorder, unspecified: Secondary | ICD-10-CM | POA: Diagnosis not present

## 2020-03-07 DIAGNOSIS — Z0000 Encounter for general adult medical examination without abnormal findings: Secondary | ICD-10-CM | POA: Diagnosis not present

## 2020-03-23 DIAGNOSIS — J029 Acute pharyngitis, unspecified: Secondary | ICD-10-CM | POA: Diagnosis not present

## 2020-03-23 DIAGNOSIS — Z20822 Contact with and (suspected) exposure to covid-19: Secondary | ICD-10-CM | POA: Diagnosis not present

## 2020-03-23 DIAGNOSIS — R059 Cough, unspecified: Secondary | ICD-10-CM | POA: Diagnosis not present

## 2020-04-14 DIAGNOSIS — R635 Abnormal weight gain: Secondary | ICD-10-CM | POA: Diagnosis not present

## 2020-04-14 DIAGNOSIS — Z0000 Encounter for general adult medical examination without abnormal findings: Secondary | ICD-10-CM | POA: Diagnosis not present

## 2020-04-14 DIAGNOSIS — R5383 Other fatigue: Secondary | ICD-10-CM | POA: Diagnosis not present

## 2020-04-18 DIAGNOSIS — F419 Anxiety disorder, unspecified: Secondary | ICD-10-CM | POA: Diagnosis not present

## 2020-04-18 DIAGNOSIS — R635 Abnormal weight gain: Secondary | ICD-10-CM | POA: Diagnosis not present

## 2020-05-17 DIAGNOSIS — Z6834 Body mass index (BMI) 34.0-34.9, adult: Secondary | ICD-10-CM | POA: Diagnosis not present

## 2020-05-17 DIAGNOSIS — E669 Obesity, unspecified: Secondary | ICD-10-CM | POA: Diagnosis not present

## 2020-07-06 IMAGING — MR MRI OF THE RIGHT KNEE WITHOUT CONTRAST
7 series · 40 of 40 positions shown · non-contrast
Comparison: None.

CLINICAL DATA: The patient suffered a right knee injury 1 week ago
when he dislocated his patella jumping in a pool. Initial encounter.

EXAM:
MRI OF THE RIGHT KNEE WITHOUT CONTRAST
TECHNIQUE: Multiplanar, multisequence MR imaging of the knee was performed. No
intravenous contrast was administered.

[Series 6: T2 fat-sat · axial · 4.0mm · 0.53mm/px · z∈[-62,+83]mm · 6 of 30 slices shown (1 of 3)]
[im 1/30]
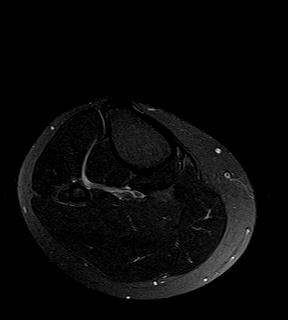
[im 6/30]
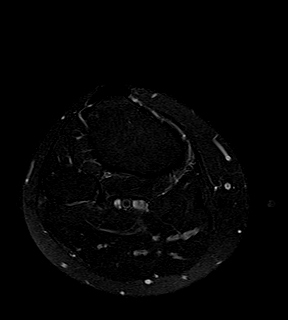
[im 12/30]
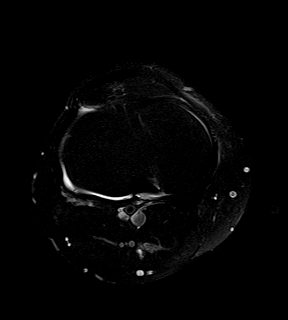
[im 18/30]
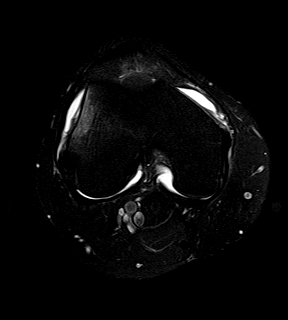
[im 24/30]
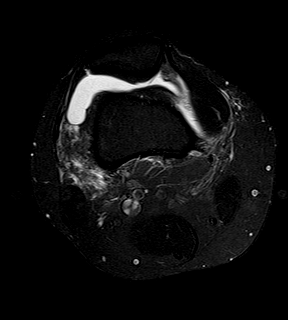
[im 30/30]
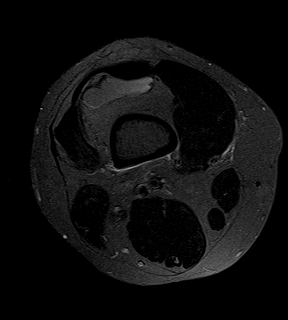

[Series 7: T1 · coronal · 4.0mm · 0.62mm/px · 6 of 30 slices shown]
[im 1/30]
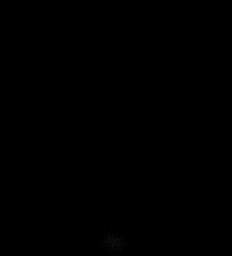
[im 6/30]
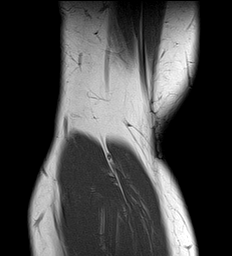
[im 12/30]
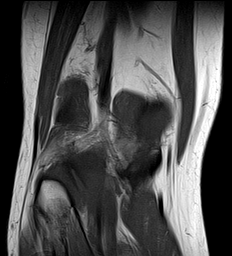
[im 18/30]
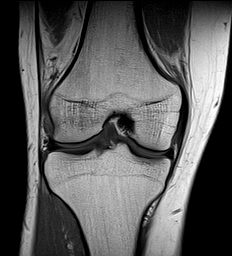
[im 24/30]
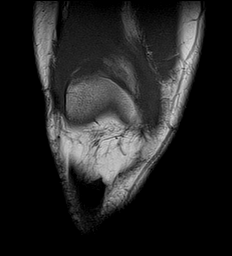
[im 30/30]
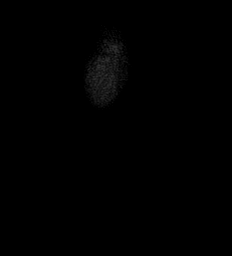

[Series 8: T2 fat-sat · sagittal · 3.0mm · 0.62mm/px · 6 of 32 slices shown (2 of 3)]
[im 1/32]
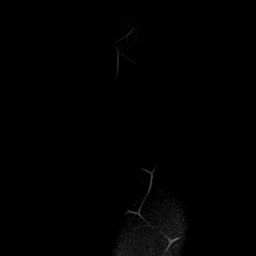
[im 7/32]
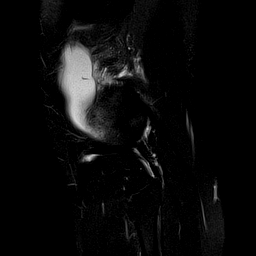
[im 13/32]
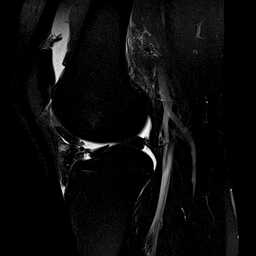
[im 19/32]
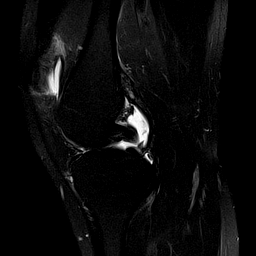
[im 25/32]
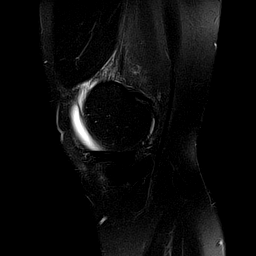
[im 32/32]
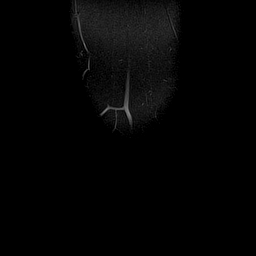

[Series 9: T2 fat-sat · coronal · 4.0mm · 0.62mm/px · 6 of 30 slices shown (3 of 3)]
[im 1/30]
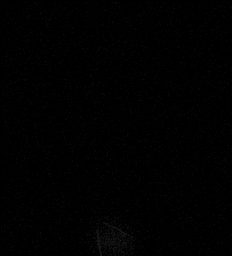
[im 6/30]
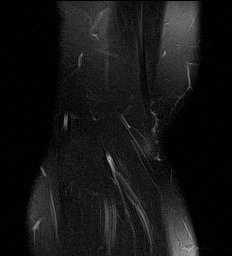
[im 12/30]
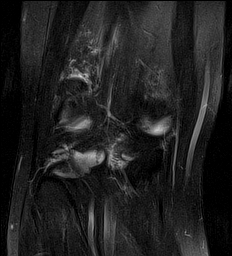
[im 18/30]
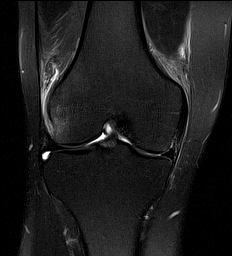
[im 24/30]
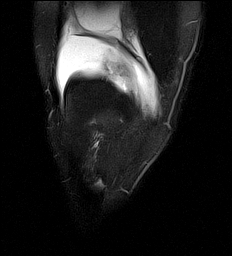
[im 30/30]
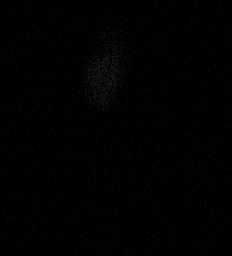

[Series 10: PD fat-sat · coronal · 4.0mm · 0.62mm/px · 6 of 30 slices shown (1 of 3)]
[im 1/30]
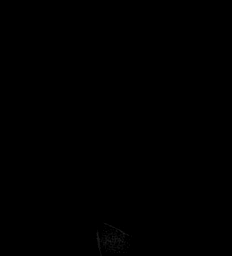
[im 6/30]
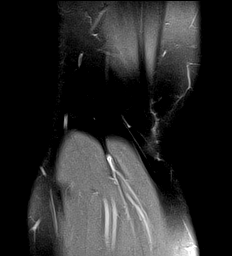
[im 12/30]
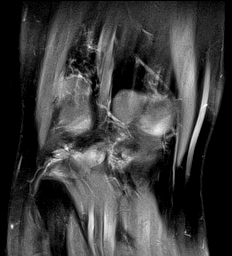
[im 18/30]
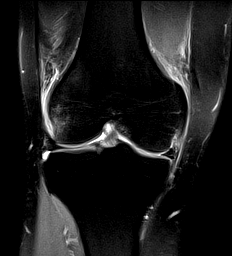
[im 24/30]
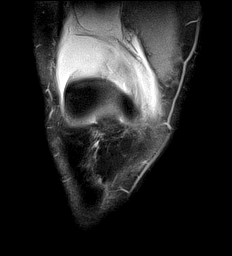
[im 30/30]
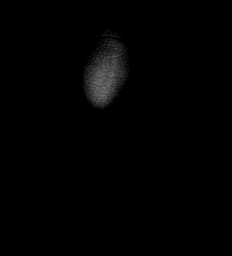

[Series 11: PD fat-sat · sagittal · 3.0mm · 0.62mm/px · 6 of 32 slices shown (2 of 3)]
[im 1/32]
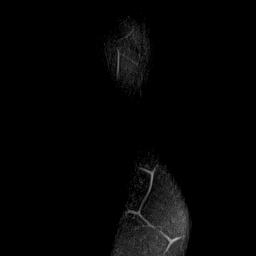
[im 7/32]
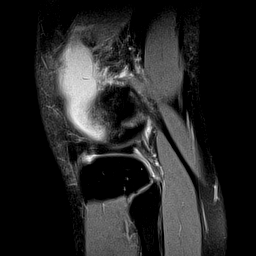
[im 13/32]
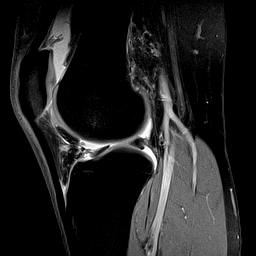
[im 19/32]
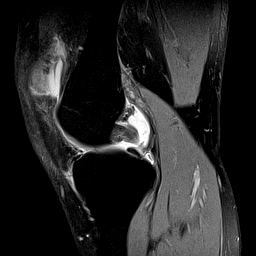
[im 25/32]
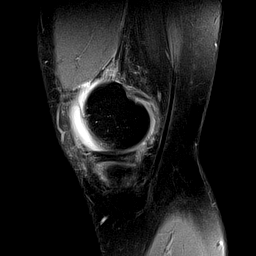
[im 32/32]
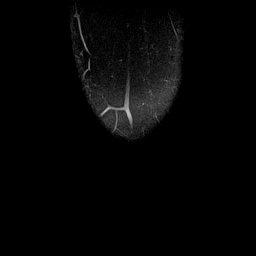

[Series 12: PD fat-sat · coronal · 2.0mm · 0.62mm/px · 4 of 19 slices shown (3 of 3)]
[im 1/19]
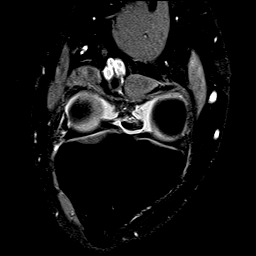
[im 7/19]
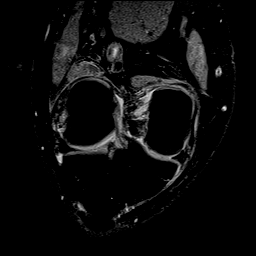
[im 13/19]
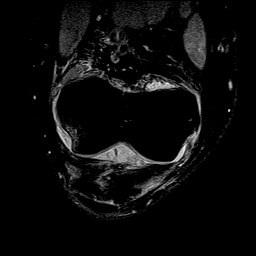
[im 19/19]
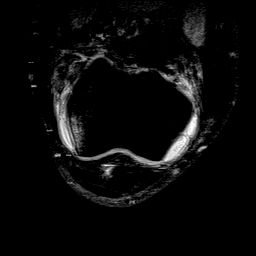

[40 of 40 positions shown; findings below may reference images not displayed]

FINDINGS: MENISCI

Medial meniscus:  Intact.

Lateral meniscus:  Intact.

LIGAMENTS

Cruciates:  Intact.

Collaterals:  Intact.

CARTILAGE

Patellofemoral: Focal fissure in hyaline cartilage is seen at the
apex of the patella in the midpole.

Medial:  Preserved.

Lateral:  Minimally degenerated

Joint:  Moderate joint effusion.

Popliteal Fossa:  No Baker's cyst.

Extensor Mechanism: The patient has bone contusions in the periphery
of the lateral femoral condyle and periphery of the medial patellar
facet consistent with transient lateral dislocation of the patella.
The extensor mechanism including the medial retinacular structures
is intact. There is patella alta. Somewhat shallow trochlear groove
is noted. TT-TG distance is borderline increased at 1.4 cm.

Bones:  As described above.  Otherwise negative.

Other: None.
IMPRESSION: Status post transient lateral dislocation of the patella. The
extensor mechanism including the MPFL is intact. Shallow trochlear
groove, patella alta and borderline increased TT-TG distance
predispose the patient to dislocation.

Focal fissure in hyaline cartilage at the apex of the patella.

Negative for meniscal or ligament tear.

## 2021-02-03 ENCOUNTER — Emergency Department (INDEPENDENT_AMBULATORY_CARE_PROVIDER_SITE_OTHER)
Admission: EM | Admit: 2021-02-03 | Discharge: 2021-02-03 | Disposition: A | Discharge status: Home / Self Care | Payer: BC Managed Care – PPO | Source: Home / Self Care

## 2021-02-03 ENCOUNTER — Emergency Department (INDEPENDENT_AMBULATORY_CARE_PROVIDER_SITE_OTHER): Payer: BC Managed Care – PPO

## 2021-02-03 ENCOUNTER — Other Ambulatory Visit: Payer: Self-pay

## 2021-02-03 DIAGNOSIS — S83005A Unspecified dislocation of left patella, initial encounter: Secondary | ICD-10-CM

## 2021-02-03 DIAGNOSIS — W010XXA Fall on same level from slipping, tripping and stumbling without subsequent striking against object, initial encounter: Secondary | ICD-10-CM

## 2021-02-03 DIAGNOSIS — M25562 Pain in left knee: Secondary | ICD-10-CM

## 2021-02-03 DIAGNOSIS — S8992XA Unspecified injury of left lower leg, initial encounter: Secondary | ICD-10-CM | POA: Diagnosis not present

## 2021-02-03 MED ORDER — ACETAMINOPHEN 500 MG PO TABS
1000.0000 mg | ORAL_TABLET | Freq: Once | ORAL | Status: AC
  Administered 2021-02-03: 1000 mg via ORAL

## 2021-02-03 MED ORDER — IBUPROFEN 800 MG PO TABS: 800.0000 mg | ORAL_TABLET | Freq: Three times a day (TID) | ORAL | 0 refills | Status: AC

## 2021-02-03 MED ORDER — HYDROCODONE-ACETAMINOPHEN 7.5-325 MG PO TABS: 1.0000 | ORAL_TABLET | Freq: Three times a day (TID) | ORAL | 0 refills | Status: AC

## 2021-02-03 NOTE — Discharge Instructions (Addendum)
Advised patient to RICE left knee for 25 minutes 2-3 times daily for the next 3 days.  Advised patient may take ibuprofen 1-2 times daily, as needed.  Advised/encouraged patient to follow-up with [facility] orthopedic provider for further evaluation early next week (provider contact information above).  Advised patient to use pain medication sparingly for breakthrough pain.  Encouraged patient increase daily water intake while taking this medication.  Patient placed in the left knee brace prior to discharge today advised patient to wear brace 24/7 except with bathing until meeting with orthopedic provider early next week.

## 2021-02-03 NOTE — ED Provider Notes (Signed)
[name]    CSN: [id number] Arrival date & time: 02/03/21  1214      History   Chief Complaint Chief Complaint  Patient presents with   Knee Injury    LT    HPI Roger Fox is a 30 y.o. male.   HPI 30 year old male presents with left knee injury reports slipped and fell in puddle of water while walking in [employer] this morning causing dislocation of left patella, patient reports is able to pop it back in place and currently reports pain as 8 of 10.  Patient has history of patellar subluxation and was evaluated here on 11/24/2018 and again by PCP/orthopedics on 02/08/2019 of right knee.  Past Medical History:  Diagnosis Date   No pertinent past medical history     Patient Active Problem List   Diagnosis Date Noted   Tinea cruris 12/28/2018   Patellar dislocation 04/07/2013    Past Surgical History:  Procedure Laterality Date   TONSILLECTOMY     WISDOM TOOTH EXTRACTION         Home Medications    Prior to Admission medications   Medication Sig Start Date End Date Taking? Authorizing Provider  escitalopram (LEXAPRO) 10 MG tablet Take 10 mg by mouth daily.   Yes [provider]  HYDROcodone-acetaminophen (NORCO) 7.5-325 MG tablet Take 1 tablet by mouth every 8 (eight) hours for 7 days. 02/03/21 02/10/21 Yes Trevor Iha, FNP  ibuprofen (ADVIL) 800 MG tablet Take 1 tablet (800 mg total) by mouth 3 (three) times daily. 02/03/21  Yes Trevor Iha, FNP  terbinafine (LAMISIL) 250 MG tablet  01/20/19   [provider]    Family History Family History  Problem Relation Age of Onset   Diabetes Other    Stroke Other    Cancer Maternal Grandfather        colon    Social History Social History   Tobacco Use   Smoking status: Former   Smokeless tobacco: Never  Substance Use Topics   Alcohol use: Yes   Drug use: No     Allergies   Sulfa antibiotics and Elemental sulfur   Review of Systems Review of Systems   Musculoskeletal:        Left knee injury/left knee pain x1 hour    Physical Exam Triage Vital Signs ED Triage Vitals  Enc Vitals Group     BP 02/03/21 1223 128/84     Pulse Rate 02/03/21 1223 67     Resp 02/03/21 1223 18     Temp 02/03/21 1223 98.4 F (36.9 C)     Temp Source 02/03/21 1223 Oral     SpO2 02/03/21 1223 97 %     Weight --      Height --      Head Circumference --      Peak Flow --      Pain Score 02/03/21 1225 8     Pain Loc --      Pain Edu? --      Excl. in GC? --    No data found.  Updated Vital Signs BP 128/84 (BP Location: Left Arm)   Pulse 67   Temp 98.4 F (36.9 C) (Oral)   Resp 18   SpO2 97%      Physical Exam Vitals and nursing note reviewed.  Constitutional:      Appearance: Normal appearance.  HENT:     Head: Normocephalic and atraumatic.     Mouth/Throat:  Mouth: Mucous membranes are moist.     Pharynx: Oropharynx is clear.  Eyes:     Extraocular Movements: Extraocular movements intact.     Conjunctiva/sclera: Conjunctivae normal.     Pupils: Pupils are equal, round, and reactive to light.  Cardiovascular:     Rate and Rhythm: Normal rate and regular rhythm.     Pulses: Normal pulses.     Heart sounds: Normal heart sounds. No murmur heard. Pulmonary:     Effort: Pulmonary effort is normal.     Breath sounds: Normal breath sounds. No wheezing, rhonchi or rales.  Musculoskeletal:        General: Swelling, tenderness and signs of injury present. No deformity.     Cervical back: Normal range of motion and neck supple.     Comments: Left knee (anterior aspect): TTP over inferior lateral aspect of patella, with mild soft tissue swelling, exam limited today due to pain  Skin:    General: Skin is warm and dry.  Neurological:     General: No focal deficit present.     Mental Status: He is alert and oriented to person, place, and time. Mental status is at baseline.  Psychiatric:        Mood and Affect: Mood normal.         Behavior: Behavior normal.        Thought Content: Thought content normal.     UC Treatments / Results  Labs (all labs ordered are listed, but only abnormal results are displayed) Labs Reviewed - No data to display  EKG   Radiology DG Knee Complete 4 Views Left  Result Date: 02/03/2021 CLINICAL DATA:  Left knee pain following a dislocation injury today. Previous patellar dislocation. EXAM: LEFT KNEE - COMPLETE 4+ VIEW COMPARISON:  03/22/2013 FINDINGS: No evidence of fracture, dislocation, or joint effusion. No evidence of arthropathy or other focal bone abnormality. Soft tissues are unremarkable. IMPRESSION: Normal examination. Electronically Signed   By: Beckie Salts M.D.   On: 02/03/2021 13:08    Procedures Procedures (including critical care time)  Medications Ordered in UC Medications  acetaminophen (TYLENOL) tablet 1,000 mg (1,000 mg Oral Given 02/03/21 1230)    Initial Impression / Assessment and Plan / UC Course  I have reviewed the triage vital signs and the nursing notes.  Pertinent labs & imaging results that were available during my care of the patient were reviewed by me and considered in my medical decision making (see chart for details).     MDM: 1.  Injury of left knee, initial encounter-left knee x-ray performed; 2.  Acute pain of left knee-left knee x-ray revealed normal exam, Rx'd Ibuprofen and Norco; 3.  Dislocation of left patella, initial encounter-referred patient to [facility] orthopedic provider for follow-up on Monday, 02/05/2021. Advised patient to RICE left knee for 25 minutes 2-3 times daily for the next 3 days.  Advised patient may take ibuprofen 1-2 times daily, as needed.  Advised/encouraged patient to follow-up with [facility] orthopedic provider for further evaluation early next week (provider contact information above).  Advised patient to use pain medication sparingly for breakthrough pain.  Encouraged patient increase daily water intake while taking this  medication.  Patient placed in the left knee brace prior to discharge today advised patient to wear brace 24/7 except with bathing until meeting with orthopedic provider early next week.  Work note provided per patient request.  Patient discharged home, hemodynamically stable. Final Clinical Impressions(s) / UC Diagnoses   Final diagnoses:  Injury of left knee, initial encounter  Acute pain of left knee  Dislocation of left patella, initial encounter     Discharge Instructions      Advised patient to RICE left knee for 25 minutes 2-3 times daily for the next 3 days.  Advised patient may take ibuprofen 1-2 times daily, as needed.  Advised/encouraged patient to follow-up with [facility] orthopedic provider for further evaluation early next week (provider contact information above).  Advised patient to use pain medication sparingly for breakthrough pain.  Encouraged patient increase daily water intake while taking this medication.  Patient placed in the left knee brace prior to discharge today advised patient to wear brace 24/7 except with bathing until meeting with orthopedic provider early next week.     ED Prescriptions     Medication Sig Dispense Auth. Provider   HYDROcodone-acetaminophen (NORCO) 7.5-325 MG tablet Take 1 tablet by mouth every 8 (eight) hours for 7 days. 21 tablet Trevor Iha, FNP   ibuprofen (ADVIL) 800 MG tablet Take 1 tablet (800 mg total) by mouth 3 (three) times daily. 30 tablet Trevor Iha, FNP      I have reviewed the PDMP during this encounter.   Trevor Iha, FNP 02/03/21 1332

## 2021-02-03 NOTE — ED Triage Notes (Signed)
Pt here today for LT knee injury. Slipped and fell in a puddle while at [facility] dislocating LT knee. Says he was able to pop it back in place. Pain 8/10

## 2021-02-05 ENCOUNTER — Other Ambulatory Visit: Payer: Self-pay

## 2021-02-05 ENCOUNTER — Ambulatory Visit: Payer: Self-pay

## 2021-02-05 ENCOUNTER — Ambulatory Visit (INDEPENDENT_AMBULATORY_CARE_PROVIDER_SITE_OTHER): Payer: BC Managed Care – PPO | Admitting: Family Medicine

## 2021-02-05 VITALS — Ht 68.0 in | Wt 225.0 lb

## 2021-02-05 DIAGNOSIS — S83005A Unspecified dislocation of left patella, initial encounter: Secondary | ICD-10-CM | POA: Diagnosis not present

## 2021-02-05 NOTE — Progress Notes (Signed)
Roger Fox - 30 y.o. male MRN [id number]  Date of birth: 01-Feb-1991  SUBJECTIVE:  Including CC & ROS.  No chief complaint on file.   Roger Fox is a 30 y.o. male that is presenting with left knee pain.  He had a patellar dislocation this Saturday at [facility].  He had trouble getting it reduced but was finally successful.  He has had multiple patellar dislocations in the same knee.  Having significant swelling and pain.  Has been using crutches and a hinged knee brace.  Independent review of the left knee x-ray from 9/10 shows no acute changes.   Review of Systems See HPI   HISTORY: Past Medical, Surgical, Social, and Family History Reviewed & Updated per EMR.   Pertinent Historical Findings include:  Past Medical History:  Diagnosis Date   No pertinent past medical history     Past Surgical History:  Procedure Laterality Date   TONSILLECTOMY     WISDOM TOOTH EXTRACTION      Family History  Problem Relation Age of Onset   Diabetes Other    Stroke Other    Cancer Maternal Grandfather        colon    Social History   Socioeconomic History   Marital status: Married    Spouse name: Not on file   Number of children: Not on file   Years of education: Not on file   Highest education level: Not on file  Occupational History   Not on file  Tobacco Use   Smoking status: Former   Smokeless tobacco: Never  Substance and Sexual Activity   Alcohol use: Yes   Drug use: No   Sexual activity: Not on file  Other Topics Concern   Not on file  Social History Narrative   Not on file   Social Determinants of Health   Financial Resource Strain: Not on file  Food Insecurity: Not on file  Transportation Needs: Not on file  Physical Activity: Not on file  Stress: Not on file  Social Connections: Not on file  Intimate Partner Violence: Not on file     PHYSICAL EXAM:  VS: Ht 5\' 8"  (1.727 m)   Wt 225 lb (102.1 kg)   BMI 34.21 kg/m  Physical Exam Gen: NAD, alert,  cooperative with exam, well-appearing MSK:  Left knee: Moderate effusion. Limited range of motion. Instability valgus varus stress testing. Laxity with patellar tilting. Neurovascular intact   Aspiration/Injection Procedure Note Roger Fox [birth year]  Procedure: Aspiration Indications: Left knee pain  Procedure Details Consent: Risks of procedure as well as the alternatives and risks of each were explained to the (patient/caregiver).  Consent for procedure obtained. Time Out: Verified patient identification, verified procedure, site/side was marked, verified correct patient position, special equipment/implants available, medications/allergies/relevent history reviewed, required imaging and test results available.  Performed.  The area was cleaned with iodine and alcohol swabs.    The left knee superior lateral suprapatellar pouch was injected using  3 cc's of 0.25% bupivacaine with a 25 1 1/2" needle.  An 18-gauge 1-1/2 inch needle was used to achieve aspiration.  Ultrasound was used. Images were obtained in long views showing the injection.    Amount of Fluid Aspirated:  50mL Character of Fluid: bloody Fluid was sent for: n/a  A sterile dressing was applied.  Patient did tolerate procedure well.     ASSESSMENT & PLAN:   Patellar dislocation Injury occurred on 9/10.  Has a large hemarthrosis today with recent patellar dislocation. -  Counseled on home exercise therapy and supportive care. -Aspiration today. -Patellar stabilizing brace. -MRI to evaluate for internal derangement given the large hemarthrosis on exam today and recent patellar dislocation.

## 2021-02-05 NOTE — Patient Instructions (Signed)
Nice to meet you Please use ice  Please try the brace during the day  Please limit walking, standing, impact, and repetitive bending, particularly if these activities cause pain. Please send me a message in MyChart with any questions or updates.  We will setup a virtual visit once the MRI is resulted.   --Dr. Jordan Likes

## 2021-02-05 NOTE — Assessment & Plan Note (Signed)
Injury occurred on 9/10.  Has a large hemarthrosis today with recent patellar dislocation. -Counseled on home exercise therapy and supportive care. -Aspiration today. -Patellar stabilizing brace. -MRI to evaluate for internal derangement given the large hemarthrosis on exam today and recent patellar dislocation.

## 2021-02-11 ENCOUNTER — Ambulatory Visit (INDEPENDENT_AMBULATORY_CARE_PROVIDER_SITE_OTHER): Payer: BC Managed Care – PPO

## 2021-02-11 ENCOUNTER — Other Ambulatory Visit: Payer: Self-pay

## 2021-02-11 DIAGNOSIS — S83005A Unspecified dislocation of left patella, initial encounter: Secondary | ICD-10-CM

## 2021-02-14 ENCOUNTER — Other Ambulatory Visit: Payer: Self-pay

## 2021-02-14 ENCOUNTER — Encounter: Payer: Self-pay | Admitting: Family Medicine

## 2021-02-14 ENCOUNTER — Telehealth (INDEPENDENT_AMBULATORY_CARE_PROVIDER_SITE_OTHER): Payer: BC Managed Care – PPO | Admitting: Family Medicine

## 2021-02-14 VITALS — Ht 68.0 in | Wt 225.0 lb

## 2021-02-14 DIAGNOSIS — S83005D Unspecified dislocation of left patella, subsequent encounter: Secondary | ICD-10-CM

## 2021-02-14 NOTE — Progress Notes (Signed)
Virtual Visit via Video Note  I connected with Roger Fox on 02/14/21 at  8:00 AM EDT by a video enabled telemedicine application and verified that I am speaking with the correct person using two identifiers.  Location: Patient: home Provider: office   I discussed the limitations of evaluation and management by telemedicine and the availability of in person appointments. The patient expressed understanding and agreed to proceed.  History of Present Illness:  Roger Fox is a 30 year old male is following up after the MRI of his left knee.  This was demonstrating changes consistent with a patellar dislocation.  This was showing a fairly shallow trochlear groove with changes suggestive of patella alto.  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  Would like to have more stability going forward. -Counseled on home exercise therapy and supportive care. -Referral to physical therapy. -Counseled on bracing. -Referral to orthopedics for consideration of the lateral release.  Follow Up Instructions:    I discussed the assessment and treatment plan with the patient. The patient was provided an opportunity to ask questions and all were answered. The patient agreed with the plan and demonstrated an understanding of the instructions.   The patient was advised to call back or seek an in-person evaluation if the symptoms worsen or if the condition fails to improve as anticipated.    Roger Gandy, MD

## 2021-02-14 NOTE — Assessment & Plan Note (Signed)
Has had repeated patellar dislocations with known structural changes on exam today.  Would like to have more stability going forward. -Counseled on home exercise therapy and supportive care. -Referral to physical therapy. -Counseled on bracing. -Referral to orthopedics for consideration of the lateral release.

## 2021-04-13 ENCOUNTER — Other Ambulatory Visit: Payer: Self-pay

## 2021-04-13 ENCOUNTER — Ambulatory Visit (INDEPENDENT_AMBULATORY_CARE_PROVIDER_SITE_OTHER): Payer: BC Managed Care – PPO | Admitting: Physical Therapy

## 2021-04-13 DIAGNOSIS — M6281 Muscle weakness (generalized): Secondary | ICD-10-CM

## 2021-04-13 DIAGNOSIS — M25562 Pain in left knee: Secondary | ICD-10-CM

## 2021-04-13 DIAGNOSIS — R2689 Other abnormalities of gait and mobility: Secondary | ICD-10-CM

## 2021-04-13 DIAGNOSIS — R600 Localized edema: Secondary | ICD-10-CM | POA: Diagnosis not present

## 2021-04-13 NOTE — Therapy (Signed)
[facility] [place] [address] [place], [place], [address] Phone: [phone]   Fax:  [phone]  Physical Therapy Evaluation  Patient Details  Name: Roger Fox MRN: [id number] Date of Birth: 15-Mar-1991 Referring Provider (PT): Dalldorf   Encounter Date: 04/13/2021   PT End of Session - 04/13/21 0852     Visit Number 1    Number of Visits 12    Date for PT Re-Evaluation 05/25/21    PT Start Time 0800    PT Stop Time 0845    PT Time Calculation (min) 45 min    Activity Tolerance Patient tolerated treatment well    Behavior During Therapy [facility] for tasks assessed/performed             Past Medical History:  Diagnosis Date   No pertinent past medical history     Past Surgical History:  Procedure Laterality Date   TONSILLECTOMY     WISDOM TOOTH EXTRACTION      There were no vitals filed for this visit.    Subjective Assessment - 04/13/21 0758     Subjective In September pt was walking in [employer] and slipped in water and dislocated his patella. He had MPFL surgery 03/15/21. He has weaned of off crutches and is wearing and knee brace at all times when walking. He has been out of work but plans to return 04/16/21.  Knee pain increases with descending stairs and prolonged gait and standing. Pain decreases with ice and occasional pain meds    Pertinent History frequent Lt patella dislocations    Limitations Standing;Walking    How long can you stand comfortably? 1-2 hours    How long can you walk comfortably? 1 hour    Patient Stated Goals improve gait, strength and mobility    Currently in Pain? Yes    Pain Score 1     Pain Location Knee    Pain Orientation Left    Pain Descriptors / Indicators Tightness    Pain Type Surgical pain    Pain Onset 1 to 4 weeks ago    Pain Frequency Intermittent    Aggravating Factors  descending stairs, prolonged gait and standing    Pain Relieving Factors ice, meds                 [facility] PT Assessment - 04/13/21 0001       Assessment   Medical Diagnosis LT knee MPFL    Referring Provider (PT) Dalldorf    Onset Date/Surgical Date 03/15/21    Next MD Visit mid december      Precautions   Precaution Comments knee brace Lt      Restrictions   Other Position/Activity Restrictions WBAT      Balance Screen   Has the patient fallen in the past 6 months Yes    How many times? 1    Has the patient had a decrease in activity level because of a fear of falling?  Yes    Is the patient reluctant to leave their home because of a fear of falling?  Yes      Prior Function   Level of Independence Independent    Vocation Requirements works in Education officer, environmental, mostly sitting      Observation/Other Assessments   Focus on Therapeutic Outcomes (FOTO)  53      Observation/Other Assessments-Edema    Edema Circumferential   Lt 41cm, Rt 39.5 cm     Functional Tests   Functional tests  Single leg stance      Single Leg Stance   Comments Lt 5 seconds limited by pain, Rt 30 sec      ROM / Strength   AROM / PROM / Strength AROM;Strength      AROM   AROM Assessment Site Knee    Right/Left Knee Left;Right    Right Knee Extension 0    Right Knee Flexion 135    Left Knee Extension 0    Left Knee Flexion 88      Strength   Overall Strength Comments Lt hip flex 3/5, hip abduction 4/5, adduction 3/5    Strength Assessment Site Knee    Right/Left Knee Right;Left    Left Knee Flexion 4-/5    Left Knee Extension 3+/5      Palpation   Patella mobility TTP with patellar mobs medial and inferior    Palpation comment TTP around incisions, discussed scar mobility      Ambulation/Gait   Assistive device --   Lt knee brace   Gait Pattern Decreased stance time - left;Decreased stride length;Decreased hip/knee flexion - left;Wide base of support                        Objective measurements completed on examination: See above findings.       [facility] Adult PT  Treatment/Exercise - 04/13/21 0001       Exercises   Exercises Knee/Hip      Knee/Hip Exercises: Stretches   Knee: Self-Stretch to increase Flexion Left;3 reps    Knee: Self-Stretch Limitations 10 second      Knee/Hip Exercises: Standing   Knee Flexion 10 reps    SLS with UE support as tolerated up to 10 seconds on LT      Knee/Hip Exercises: Supine   Straight Leg Raises 10 reps      Knee/Hip Exercises: Sidelying   Hip ABduction 10 reps    Hip ADduction 10 reps      Modalities   Modalities Vasopneumatic      Vasopneumatic   Number Minutes Vasopneumatic  10 minutes    Vasopnuematic Location  Knee    Vasopneumatic Pressure Medium    Vasopneumatic Temperature  34                     PT Education - 04/13/21 0831     Education Details PT POC and goals, HEP    Person(s) Educated Patient    Methods Explanation;Demonstration;Handout    Comprehension Returned demonstration;Verbalized understanding                 PT Long Term Goals - 04/13/21 0856       PT LONG TERM GOAL #1   Title Pt wil be independent with HEP    Time 6    Period Weeks    Status New    Target Date 05/25/21      PT LONG TERM GOAL #2   Title Pt will improve FOTO to >= 71 to demo improved functional mobility    Time 6    Period Weeks    Status New    Target Date 05/25/21      PT LONG TERM GOAL #3   Title Pt will improve Lt LE strength to 4+/5 to progress towards return to tae kwon do    Time 6    Period Weeks    Status New    Target Date 05/25/21  PT LONG TERM GOAL #4   Title Pt will improve Lt knee flexion ROM to >= 120 to improve ability to negotiate stairs with decreased pain    Time 6    Period Weeks    Status New    Target Date 05/25/21                    Plan - 04/13/21 0853     Clinical Impression Statement Pt is a 30 y/o male referred s/p Lt MPFL repair. Pt presents with increased pain and swelling, decreased strength and ROM, impaired gait and  balance. Pt will benefit from skilled PT to address deficits and improve functional mobility    Personal Factors and Comorbidities Comorbidity 1;Past/Current Experience    Examination-Activity Limitations Stairs;Locomotion Level;Stand;Squat    Examination-Participation Restrictions Yard Work;Community Activity;Cleaning    Stability/Clinical Decision Making Stable/Uncomplicated    Clinical Decision Making Low    Rehab Potential Good    PT Frequency 2x / week    PT Duration 6 weeks    PT Treatment/Interventions Aquatic Therapy;Cryotherapy;Moist Heat;Iontophoresis 4mg /ml Dexamethasone;Electrical Stimulation;Gait training;Stair training;Neuromuscular re-education;Balance training;Therapeutic exercise;Therapeutic activities;Patient/family education;Manual techniques;Taping;Dry needling;Passive range of motion;Vasopneumatic Device    PT Next Visit Plan assess HEP, progress per protocol    PT Home Exercise Plan [email or web address]    Consulted and Agree with Plan of Care Patient             Patient will benefit from skilled therapeutic intervention in order to improve the following deficits and impairments:  Pain, Decreased strength, Decreased activity tolerance, Decreased range of motion, Increased edema, Difficulty walking, Decreased balance, Abnormal gait  Visit Diagnosis: Acute pain of left knee - Plan: PT plan of care cert/re-cert  Muscle weakness (generalized) - Plan: PT plan of care cert/re-cert  Other abnormalities of gait and mobility - Plan: PT plan of care cert/re-cert  Localized edema - Plan: PT plan of care cert/re-cert     Problem List Patient Active Problem List   Diagnosis Date Noted   Tinea cruris 12/28/2018   Patellar dislocation 04/07/2013    Lacheryl Niesen, PT 04/13/2021, 9:00 AM  [facility] [address] [place] [address] [place], [place], [place] Phone: [phone]   Fax:  [phone]  Name: Roger Fox MRN:  Princess Perna Date of Birth: [birth year]

## 2021-04-13 NOTE — Patient Instructions (Signed)
Access Code: [email or web address] URL: [email or web address] Date: 04/13/2021 Prepared by: [employer]  Exercises Supine Heel Slide with Strap - 1 x daily - 7 x weekly - 1 sets - 10 reps - 5 seconds hold Sidelying Hip Abduction - 1 x daily - 7 x weekly - 3 sets - 10 reps Active Straight Leg Raise with Quad Set - 1 x daily - 7 x weekly - 3 sets - 10 reps Sidelying Hip Adduction - 1 x daily - 7 x weekly - 3 sets - 10 reps Standing Knee Flexion AROM with Chair Support - 1 x daily - 7 x weekly - 3 sets - 10 reps Standing Single Leg Stance with Counter Support - 1 x daily - 7 x weekly - 3 sets - 1 reps - 20-30 seconds hold

## 2021-04-16 ENCOUNTER — Ambulatory Visit (INDEPENDENT_AMBULATORY_CARE_PROVIDER_SITE_OTHER): Payer: BC Managed Care – PPO | Admitting: Physical Therapy

## 2021-04-16 ENCOUNTER — Other Ambulatory Visit: Payer: Self-pay

## 2021-04-16 DIAGNOSIS — R600 Localized edema: Secondary | ICD-10-CM | POA: Diagnosis not present

## 2021-04-16 DIAGNOSIS — M6281 Muscle weakness (generalized): Secondary | ICD-10-CM | POA: Diagnosis not present

## 2021-04-16 DIAGNOSIS — R2689 Other abnormalities of gait and mobility: Secondary | ICD-10-CM | POA: Diagnosis not present

## 2021-04-16 DIAGNOSIS — M25562 Pain in left knee: Secondary | ICD-10-CM | POA: Diagnosis not present

## 2021-04-16 NOTE — Therapy (Signed)
[facility] [place] [address] [place], [place], [address] Phone: [phone]   Fax:  [phone]  Physical Therapy Treatment  Patient Details  Name: Roger Fox MRN: [id number] Date of Birth: 01/12/1991 Referring Provider (PT): Dalldorf   Encounter Date: 04/16/2021   PT End of Session - 04/16/21 0836     Visit Number 2    Number of Visits 12    Date for PT Re-Evaluation 05/25/21    PT Start Time 0800    PT Stop Time 0845    PT Time Calculation (min) 45 min    Activity Tolerance Patient tolerated treatment well    Behavior During Therapy [facility] for tasks assessed/performed             Past Medical History:  Diagnosis Date   No pertinent past medical history     Past Surgical History:  Procedure Laterality Date   TONSILLECTOMY     WISDOM TOOTH EXTRACTION      There were no vitals filed for this visit.       [facility] PT Assessment - 04/16/21 0001       Assessment   Medical Diagnosis LT knee MPFL    Referring Provider (PT) Dalldorf    Onset Date/Surgical Date 03/15/21    Next MD Visit mid december      AROM   Left Knee Flexion 111                           [facility] Adult PT Treatment/Exercise - 04/16/21 0001       Knee/Hip Exercises: Stretches   Knee: Self-Stretch to increase Flexion 5 reps;10 seconds      Knee/Hip Exercises: Aerobic   Recumbent Bike 4 min for warm up and AAROM      Knee/Hip Exercises: Standing   Wall Squat 10 reps    Wall Squat Limitations quarter squat with ball squeeze x 10    SLS 2 x 30 sec with intermittent UE support    SLS with Vectors Lt x 10 with tapping 3 direction    Other Standing Knee Exercises sidestep with red TB 2 x 15 steps bilat      Knee/Hip Exercises: Supine   Straight Leg Raises 10 reps    Straight Leg Raises Limitations cues for quad set      Knee/Hip Exercises: Sidelying   Hip ABduction 10 reps    Hip ADduction 10 reps       Vasopneumatic   Number Minutes Vasopneumatic  10 minutes    Vasopnuematic Location  Knee    Vasopneumatic Pressure Medium    Vasopneumatic Temperature  34                          PT Long Term Goals - 04/13/21 0856       PT LONG TERM GOAL #1   Title Pt wil be independent with HEP    Time 6    Period Weeks    Status New    Target Date 05/25/21      PT LONG TERM GOAL #2   Title Pt will improve FOTO to >= 71 to demo improved functional mobility    Time 6    Period Weeks    Status New    Target Date 05/25/21      PT LONG TERM GOAL #3   Title Pt will improve Lt LE strength to 4+/5  to progress towards return to tae kwon do    Time 6    Period Weeks    Status New    Target Date 05/25/21      PT LONG TERM GOAL #4   Title Pt will improve Lt knee flexion ROM to >= 120 to improve ability to negotiate stairs with decreased pain    Time 6    Period Weeks    Status New    Target Date 05/25/21                   Plan - 04/16/21 0836     Clinical Impression Statement Pt with much improved knee flexion ROM this visit. He still has most difficulty with adduction due to weakness and pain. Improved SLS and able to progress to SLS with vectors    PT Next Visit Plan progress strength, ROM and balance per protocol    PT Home Exercise Plan [email or web address]    Consulted and Agree with Plan of Care Patient             Patient will benefit from skilled therapeutic intervention in order to improve the following deficits and impairments:     Visit Diagnosis: Acute pain of left knee  Muscle weakness (generalized)  Other abnormalities of gait and mobility  Localized edema     Problem List Patient Active Problem List   Diagnosis Date Noted   Tinea cruris 12/28/2018   Patellar dislocation 04/07/2013    Clarion Mooneyhan, PT 04/16/2021, 8:39 AM  [facility] [address] [place] [address] [place], [place],  [address] Phone: [phone]   Fax:  [phone]  Name: Roger Fox MRN: [id number] Date of Birth: 10-May-1991

## 2021-04-18 ENCOUNTER — Ambulatory Visit (INDEPENDENT_AMBULATORY_CARE_PROVIDER_SITE_OTHER): Payer: BC Managed Care – PPO | Admitting: Physical Therapy

## 2021-04-18 ENCOUNTER — Other Ambulatory Visit: Payer: Self-pay

## 2021-04-18 DIAGNOSIS — R2689 Other abnormalities of gait and mobility: Secondary | ICD-10-CM

## 2021-04-18 DIAGNOSIS — R600 Localized edema: Secondary | ICD-10-CM | POA: Diagnosis not present

## 2021-04-18 DIAGNOSIS — M25562 Pain in left knee: Secondary | ICD-10-CM

## 2021-04-18 DIAGNOSIS — M6281 Muscle weakness (generalized): Secondary | ICD-10-CM | POA: Diagnosis not present

## 2021-04-18 NOTE — Therapy (Signed)
[facility] [place] [address] [place] [address] [place], [place], [address] Phone: [phone]   Fax:  [phone]  Physical Therapy Treatment  Patient Details  Name: Roger Fox MRN: [id number] Date of Birth: [birth year] Referring Provider (PT): Dalldorf   Encounter Date: 04/18/2021   PT End of Session - 04/18/21 0839     Visit Number 3    Number of Visits 12    Date for PT Re-Evaluation 05/25/21    PT Start Time 0803    PT Stop Time 0850    PT Time Calculation (min) 47 min    Activity Tolerance Patient tolerated treatment well    Behavior During Therapy [facility] for tasks assessed/performed             Past Medical History:  Diagnosis Date   No pertinent past medical history     Past Surgical History:  Procedure Laterality Date   TONSILLECTOMY     WISDOM TOOTH EXTRACTION      There were no vitals filed for this visit.   Subjective Assessment - 04/18/21 0809     Subjective Pt states he was in more pain yesterday after working a full day on Monday. He is going to try to find a way to elevate and ice at work    Currently in Pain? Yes    Pain Score 2     Pain Location Knee    Pain Orientation Left    Pain Descriptors / Indicators Tightness;Aching;Sore    Pain Type Surgical pain                [facility] PT Assessment - 04/18/21 0001       Assessment   Medical Diagnosis LT knee MPFL    Referring Provider (PT) Dalldorf    Onset Date/Surgical Date 03/15/21    Next MD Visit mid december                           [facility] Adult PT Treatment/Exercise - 04/18/21 0001       Knee/Hip Exercises: Aerobic   Recumbent Bike 4 min for warm up and AAROM      Knee/Hip Exercises: Standing   Wall Squat 10 reps    Wall Squat Limitations quarter squat with ball squeeze x 10    SLS with Vectors on foam x 10 standing on Lt LE    Rebounder ball toss 2 x 10 red med ball with SLS    Other Standing Knee Exercises sidestep  green TB      Knee/Hip Exercises: Supine   Straight Leg Raises 20 reps    Straight Leg Raises Limitations 2# with quad set      Knee/Hip Exercises: Sidelying   Hip ABduction 20 reps    Hip ABduction Limitations 2#    Hip ADduction 10 reps    Hip ADduction Limitations 2#      Vasopneumatic   Number Minutes Vasopneumatic  10 minutes    Vasopnuematic Location  Knee    Vasopneumatic Pressure Medium    Vasopneumatic Temperature  34                          PT Long Term Goals - 04/13/21 0856       PT LONG TERM GOAL #1   Title Pt wil be independent with HEP    Time 6    Period Weeks    Status New  Target Date 05/25/21      PT LONG TERM GOAL #2   Title Pt will improve FOTO to >= 71 to demo improved functional mobility    Time 6    Period Weeks    Status New    Target Date 05/25/21      PT LONG TERM GOAL #3   Title Pt will improve Lt LE strength to 4+/5 to progress towards return to tae kwon do    Time 6    Period Weeks    Status New    Target Date 05/25/21      PT LONG TERM GOAL #4   Title Pt will improve Lt knee flexion ROM to >= 120 to improve ability to negotiate stairs with decreased pain    Time 6    Period Weeks    Status New    Target Date 05/25/21                   Plan - 04/18/21 0840     Clinical Impression Statement Pt with good tolerance to addition of weights for SLR today. Educated pt on importance of ice and elevation throughout the work day to reduce pain    PT Next Visit Plan progress strength, ROM and balance per protocol. update HEP    PT Home Exercise Plan [email or web address]    Consulted and Agree with Plan of Care Patient             Patient will benefit from skilled therapeutic intervention in order to improve the following deficits and impairments:     Visit Diagnosis: Acute pain of left knee  Muscle weakness (generalized)  Other abnormalities of gait and mobility  Localized edema     Problem List Patient  Active Problem List   Diagnosis Date Noted   Tinea cruris 12/28/2018   Patellar dislocation 04/07/2013    Adely Facer, PT 04/18/2021, 8:42 AM  [facility] [address] [place] [address] [place], [place], [address] Phone: [phone]   Fax:  [phone]  Name: Roger Fox MRN: [id number] Date of Birth: 1990/11/04

## 2021-04-24 ENCOUNTER — Encounter: Payer: Self-pay | Admitting: Physical Therapy

## 2021-04-24 ENCOUNTER — Other Ambulatory Visit: Payer: Self-pay

## 2021-04-24 ENCOUNTER — Ambulatory Visit (INDEPENDENT_AMBULATORY_CARE_PROVIDER_SITE_OTHER): Payer: BC Managed Care – PPO | Admitting: Physical Therapy

## 2021-04-24 DIAGNOSIS — R2689 Other abnormalities of gait and mobility: Secondary | ICD-10-CM

## 2021-04-24 DIAGNOSIS — M25562 Pain in left knee: Secondary | ICD-10-CM

## 2021-04-24 DIAGNOSIS — M6281 Muscle weakness (generalized): Secondary | ICD-10-CM | POA: Diagnosis not present

## 2021-04-24 DIAGNOSIS — R600 Localized edema: Secondary | ICD-10-CM | POA: Diagnosis not present

## 2021-04-24 NOTE — Therapy (Signed)
[place], [place], [address] Phone: [phone]   Fax:  [phone]  Physical Therapy Treatment  Patient Details  Name: Roger Fox MRN: [id number] Date of Birth: 1990/12/25 Referring Provider (PT): Dalldorf   Encounter Date: 04/24/2021   PT End of Session - 04/24/21 0814     Visit Number 4    Number of Visits 12    Date for PT Re-Evaluation 05/25/21    PT Start Time 0804    PT Stop Time 0852    PT Time Calculation (min) 48 min    Activity Tolerance Patient tolerated treatment well    Behavior During Therapy [facility] for tasks assessed/performed             Past Medical History:  Diagnosis Date   No pertinent past medical history     Past Surgical History:  Procedure Laterality Date   TONSILLECTOMY     WISDOM TOOTH EXTRACTION      There were no vitals filed for this visit.   Subjective Assessment - 04/24/21 0814     Subjective Pt reports that he continues to have pain when extending his knee out of full flexion, and then at full extension.  Pain resolves with rest. He has started doing some scar massage to knee.    Pertinent History frequent Lt patella dislocations    How long can you walk comfortably? 1 hour    Patient Stated Goals improve gait, strength and mobility    Currently in Pain? No/denies    Pain Score 0-No pain                [facility] PT Assessment - 04/24/21 0001       Assessment   Medical Diagnosis Lt knee MPFL    Referring Provider (PT) Dalldorf    Onset Date/Surgical Date 03/15/21    Next MD Visit 05/09/21      AROM   Left Knee Extension 0    Left Knee Flexion 139              [facility] Adult PT Treatment/Exercise - 04/24/21 0001       Self-Care   Self-Care Other Self-Care Comments    Other Self-Care Comments  pt instructed to roll Lt arch over tennis ball to ease tightness and discomfort after SLS exercises.  reviewed scar mobilization with demo       Knee/Hip Exercises: Stretches   Passive Hamstring Stretch Left;2 reps;30 seconds   supine with strap   Quad Stretch Left;Right;1 rep;20 seconds   standing   Other Knee/Hip Stretches supine Lt adductor stretch with strap x 30 sec.      Knee/Hip Exercises: Aerobic   Recumbent Bike L1-2 x 5 min for warm up    Other Aerobic single laps around gym in between exercises to assess response to exercise.      Knee/Hip Exercises: Standing   Lateral Step Up Limitations Rt heel taps with LLE on 4" step x 10 with cues for hip hinge, x 10, light UE support on rail.  5 reps on opp side to see comparison.    Wall Squat 1 set;10 reps;5 seconds   quarter squat with ball squeeze   SLS 30 sec x 2 with horiz/vertical head turns.  Lt SLS forward leans (diver) to touch chair and returning without touching Lt foot to ground in between reps x 5.    SLS with Vectors LLE on foam with Rt toe taps front, side,  back x 10      Knee/Hip Exercises: Sidelying   Hip ABduction 20 reps;Left    Hip ABduction Limitations 2#    Hip ADduction 10 reps   2 sets   Hip ADduction Limitations 2#      Vasopneumatic   Number Minutes Vasopneumatic  10 minutes    Vasopnuematic Location  Knee   Lt   Vasopneumatic Pressure Medium    Vasopneumatic Temperature  34                          PT Long Term Goals - 04/24/21 7948       PT LONG TERM GOAL #1   Title Pt wil be independent with HEP    Time 6    Period Weeks    Status On-going      PT LONG TERM GOAL #2   Title Pt will improve FOTO to >= 71 to demo improved functional mobility    Time 6    Period Weeks    Status On-going      PT LONG TERM GOAL #3   Title Pt will improve Lt LE strength to 4+/5 to progress towards return to tae kwon do    Time 6    Period Weeks    Status On-going      PT LONG TERM GOAL #4   Title Pt will improve Lt knee flexion ROM to >= 120 to improve ability to negotiate stairs with decreased pain    Time 6    Period Weeks     Status Achieved                   Plan - 04/24/21 0813     Clinical Impression Statement Pt reported pain in Lt patellar tendon area of 4/10 with standing quad stretch; resolved with rest.  Some cramping in arch of foot after SLS exercises. Otherwise, exercises were tolerated well.  Pt has met LTG #4.    PT Frequency 2x / week    PT Duration 6 weeks    PT Treatment/Interventions Aquatic Therapy;Cryotherapy;Moist Heat;Iontophoresis 11m/ml Dexamethasone;Electrical Stimulation;Gait training;Stair training;Neuromuscular re-education;Balance training;Therapeutic exercise;Therapeutic activities;Patient/family education;Manual techniques;Taping;Dry needling;Passive range of motion;Vasopneumatic Device    PT Next Visit Plan progress strength, ROM and balance per protocol. update HEP    PT Home Exercise Plan [email or web address]    Consulted and Agree with Plan of Care Patient             Patient will benefit from skilled therapeutic intervention in order to improve the following deficits and impairments:  Pain, Decreased strength, Decreased activity tolerance, Decreased range of motion, Increased edema, Difficulty walking, Decreased balance, Abnormal gait  Visit Diagnosis: Acute pain of left knee  Muscle weakness (generalized)  Other abnormalities of gait and mobility  Localized edema     Problem List Patient Active Problem List   Diagnosis Date Noted   Tinea cruris 12/28/2018   Patellar dislocation 04/07/2013   JKerin Perna PTA 04/24/21 9:17 AM  [facility] [address]Phone: [phone]  Fax:  [phone] Name: Roger ShellMRN: [id number] of Birth: [birth year]

## 2021-04-27 ENCOUNTER — Encounter: Payer: BC Managed Care – PPO | Admitting: Physical Therapy

## 2021-05-01 ENCOUNTER — Other Ambulatory Visit: Payer: Self-pay

## 2021-05-01 ENCOUNTER — Ambulatory Visit (INDEPENDENT_AMBULATORY_CARE_PROVIDER_SITE_OTHER): Payer: BC Managed Care – PPO | Admitting: Physical Therapy

## 2021-05-01 DIAGNOSIS — R2689 Other abnormalities of gait and mobility: Secondary | ICD-10-CM | POA: Diagnosis not present

## 2021-05-01 DIAGNOSIS — M6281 Muscle weakness (generalized): Secondary | ICD-10-CM | POA: Diagnosis not present

## 2021-05-01 DIAGNOSIS — R600 Localized edema: Secondary | ICD-10-CM

## 2021-05-01 DIAGNOSIS — M25562 Pain in left knee: Secondary | ICD-10-CM

## 2021-05-01 NOTE — Therapy (Signed)
[facility] [place] [address] [place] [place], [place], [address] Phone: [phone]   Fax:  [phone]  Physical Therapy Treatment  Patient Details  Name: Roger Fox MRN: [id number] Date of Birth: 05-02-1991 Referring Provider (PT): Dalldorf   Encounter Date: 05/01/2021   PT End of Session - 05/01/21 0841     Visit Number 5    Number of Visits 12    Date for PT Re-Evaluation 05/25/21    PT Start Time 0800    PT Stop Time 0848    PT Time Calculation (min) 48 min    Activity Tolerance Patient tolerated treatment well    Behavior During Therapy [facility] for tasks assessed/performed             Past Medical History:  Diagnosis Date   No pertinent past medical history     Past Surgical History:  Procedure Laterality Date   TONSILLECTOMY     WISDOM TOOTH EXTRACTION      There were no vitals filed for this visit.   Subjective Assessment - 05/01/21 0806     Subjective Pt states he sometimes feels his knee needs to "pop" especially after being in one position for a long time. He does not have much pain, just stiffness    Patient Stated Goals improve gait, strength and mobility    Currently in Pain? No/denies                [facility] PT Assessment - 05/01/21 0001       Assessment   Medical Diagnosis Lt knee MPFL    Referring Provider (PT) Dalldorf    Onset Date/Surgical Date 03/15/21    Next MD Visit 05/09/21                           [facility] Adult PT Treatment/Exercise - 05/01/21 0001       Knee/Hip Exercises: Stretches   Passive Hamstring Stretch Left;2 reps;30 seconds   supine with strap   Quad Stretch Left;Right;1 rep;20 seconds   standing     Knee/Hip Exercises: Aerobic   Recumbent Bike L1-2 x 5 min for warm up      Knee/Hip Exercises: Machines for Strengthening   Cybex Leg Press 8 plates x 10 bilat LE, Lt LE only 5 plates x 10      Knee/Hip Exercises: Standing   Lateral Step Up  Limitations Rt heel taps 4'' step 2 x 10, intermittent UE support    Wall Squat 1 set;10 reps;5 seconds   quarter squat with ball squeeze   SLS Diver to chair x 10    SLS with Vectors LLE on foam with Rt toe taps front, side, back x 10      Vasopneumatic   Number Minutes Vasopneumatic  10 minutes    Vasopnuematic Location  Knee    Vasopneumatic Pressure Medium    Vasopneumatic Temperature  34                          PT Long Term Goals - 04/24/21 7564       PT LONG TERM GOAL #1   Title Pt wil be independent with HEP    Time 6    Period Weeks    Status On-going      PT LONG TERM GOAL #2   Title Pt will improve FOTO to >= 71 to demo improved functional mobility  Time 6    Period Weeks    Status On-going      PT LONG TERM GOAL #3   Title Pt will improve Lt LE strength to 4+/5 to progress towards return to tae kwon do    Time 6    Period Weeks    Status On-going      PT LONG TERM GOAL #4   Title Pt will improve Lt knee flexion ROM to >= 120 to improve ability to negotiate stairs with decreased pain    Time 6    Period Weeks    Status Achieved                   Plan - 05/01/21 0092     Clinical Impression Statement Pt continues with mm weakness notable with eccentric step downs and with balance challenges. He responded well to progression of strengthening exercises today with no increase in pain    PT Next Visit Plan progress strength, ROM and balance per protocol. update HEP    PT Home Exercise Plan [email or web address]    Consulted and Agree with Plan of Care Patient             Patient will benefit from skilled therapeutic intervention in order to improve the following deficits and impairments:     Visit Diagnosis: Acute pain of left knee  Muscle weakness (generalized)  Other abnormalities of gait and mobility  Localized edema     Problem List Patient Active Problem List   Diagnosis Date Noted   Tinea cruris 12/28/2018   Patellar  dislocation 04/07/2013    Charnell Peplinski, PT 05/01/2021, 8:44 AM  [facility] [address] [address] [place], [place], [address] Phone: [phone]   Fax:  [phone]  Name: Roger Fox MRN: [id number] Date of Birth: Feb 19, 1991

## 2021-05-04 ENCOUNTER — Other Ambulatory Visit: Payer: Self-pay

## 2021-05-04 ENCOUNTER — Ambulatory Visit (INDEPENDENT_AMBULATORY_CARE_PROVIDER_SITE_OTHER): Payer: BC Managed Care – PPO | Admitting: Physical Therapy

## 2021-05-04 DIAGNOSIS — R600 Localized edema: Secondary | ICD-10-CM | POA: Diagnosis not present

## 2021-05-04 DIAGNOSIS — R2689 Other abnormalities of gait and mobility: Secondary | ICD-10-CM

## 2021-05-04 DIAGNOSIS — M6281 Muscle weakness (generalized): Secondary | ICD-10-CM | POA: Diagnosis not present

## 2021-05-04 DIAGNOSIS — M25562 Pain in left knee: Secondary | ICD-10-CM

## 2021-05-04 NOTE — Therapy (Signed)
[facility] [place] [address] [place] [address] [place], [place], [address] Phone: [phone]   Fax:  [phone]  Physical Therapy Treatment  Patient Details  Name: Roger Fox MRN: [id number] Date of Birth: 1990/11/06 Referring Provider (PT): Dalldorf   Encounter Date: 05/04/2021   PT End of Session - 05/04/21 0836     Visit Number 6    Number of Visits 12    Date for PT Re-Evaluation 05/25/21    PT Start Time 0800    PT Stop Time 0848    PT Time Calculation (min) 48 min    Activity Tolerance Patient tolerated treatment well    Behavior During Therapy [facility] for tasks assessed/performed             Past Medical History:  Diagnosis Date   No pertinent past medical history     Past Surgical History:  Procedure Laterality Date   TONSILLECTOMY     WISDOM TOOTH EXTRACTION      There were no vitals filed for this visit.   Subjective Assessment - 05/04/21 0804     Subjective Pt states no c/o pain, only feels "tightness" when moving into extension from full flexion    Currently in Pain? No/denies                [facility] PT Assessment - 05/04/21 0001       Assessment   Medical Diagnosis Lt knee MPFL    Referring Provider (PT) Dalldorf    Onset Date/Surgical Date 03/15/21    Next MD Visit 05/09/21      Strength   Overall Strength Comments Lt hip flex 4+/5, hip abd 4/5                           [facility] Adult PT Treatment/Exercise - 05/04/21 0001       Knee/Hip Exercises: Stretches   Passive Hamstring Stretch Left;2 reps;30 seconds   supine with strap     Knee/Hip Exercises: Aerobic   Recumbent Bike L4 x 4 min for warm up      Knee/Hip Exercises: Standing   Terminal Knee Extension Left;15 reps    Theraband Level (Terminal Knee Extension) Level 4 (Blue)    Lateral Step Up Limitations Rt heel taps 4'' step 2 x 10, without UE support    Forward Step Up Hand Hold: 0;10 reps    Forward Step Up  Limitations 12'' step holding blue med ball    Wall Squat 1 set;10 reps;5 seconds   quarter squat with ball squeeze   SLS Diver to chair x 10    Rebounder 20 tosses yellow med ball SLS on foam    Walking with Sports Cord sidestep red TB 2 laps      Vasopneumatic   Number Minutes Vasopneumatic  10 minutes    Vasopnuematic Location  Knee    Vasopneumatic Pressure Medium    Vasopneumatic Temperature  34                          PT Long Term Goals - 04/24/21 2563       PT LONG TERM GOAL #1   Title Pt wil be independent with HEP    Time 6    Period Weeks    Status On-going      PT LONG TERM GOAL #2   Title Pt will improve FOTO to >= 71 to demo improved  functional mobility    Time 6    Period Weeks    Status On-going      PT LONG TERM GOAL #3   Title Pt will improve Lt LE strength to 4+/5 to progress towards return to tae kwon do    Time 6    Period Weeks    Status On-going      PT LONG TERM GOAL #4   Title Pt will improve Lt knee flexion ROM to >= 120 to improve ability to negotiate stairs with decreased pain    Time 6    Period Weeks    Status Achieved                   Plan - 05/04/21 0175     Clinical Impression Statement Pt with good tolerance to addition of TKE and 12'' step up this visit. Improving control with SLS and eccentric step downs    PT Next Visit Plan 6'' step downs, bosu balance?    PT Home Exercise Plan [email or web address]    Consulted and Agree with Plan of Care Patient             Patient will benefit from skilled therapeutic intervention in order to improve the following deficits and impairments:     Visit Diagnosis: Acute pain of left knee  Muscle weakness (generalized)  Other abnormalities of gait and mobility  Localized edema     Problem List Patient Active Problem List   Diagnosis Date Noted   Tinea cruris 12/28/2018   Patellar dislocation 04/07/2013    Azzie Thiem, PT 05/04/2021, 8:39 AM  [facility] [address] [place], [place], [address] Phone: [phone]   Fax:  [phone]  Name: Roger Fox MRN: [id number] Date of Birth: March 09, 1991

## 2021-05-08 ENCOUNTER — Other Ambulatory Visit: Payer: Self-pay

## 2021-05-08 ENCOUNTER — Ambulatory Visit (INDEPENDENT_AMBULATORY_CARE_PROVIDER_SITE_OTHER): Payer: BC Managed Care – PPO | Admitting: Physical Therapy

## 2021-05-08 DIAGNOSIS — R600 Localized edema: Secondary | ICD-10-CM

## 2021-05-08 DIAGNOSIS — M6281 Muscle weakness (generalized): Secondary | ICD-10-CM

## 2021-05-08 DIAGNOSIS — R2689 Other abnormalities of gait and mobility: Secondary | ICD-10-CM | POA: Diagnosis not present

## 2021-05-08 DIAGNOSIS — M25562 Pain in left knee: Secondary | ICD-10-CM

## 2021-05-08 NOTE — Therapy (Signed)
[facility] [place] [address] [place], [place], [address] Phone: [phone]   Fax:  [phone]  Physical Therapy Treatment  Patient Details  Name: Roger Fox MRN: [id number] Date of Birth: 31-Aug-1990 Referring Provider (PT): Dalldorf   Encounter Date: 05/08/2021   PT End of Session - 05/08/21 0837     Visit Number 7    Number of Visits 12    Date for PT Re-Evaluation 05/25/21    PT Start Time 0800    PT Stop Time 0847    PT Time Calculation (min) 47 min    Activity Tolerance Patient tolerated treatment well    Behavior During Therapy [facility] for tasks assessed/performed             Past Medical History:  Diagnosis Date   No pertinent past medical history     Past Surgical History:  Procedure Laterality Date   TONSILLECTOMY     WISDOM TOOTH EXTRACTION      There were no vitals filed for this visit.   Subjective Assessment - 05/08/21 0804     Subjective Pt continues with c/o "tightness" but no c/o pain    Patient Stated Goals improve gait, strength and mobility    Currently in Pain? No/denies                [facility] PT Assessment - 05/08/21 0001       Assessment   Medical Diagnosis Lt knee MPFL    Referring Provider (PT) Dalldorf    Onset Date/Surgical Date 03/15/21    Next MD Visit 05/09/21      Strength   Overall Strength Comments Lt hip flex 4+/5, hip abd 4/5                           [facility] Adult PT Treatment/Exercise - 05/08/21 0001       Knee/Hip Exercises: Aerobic   Elliptical L2 x 3 min for warm up      Knee/Hip Exercises: Machines for Strengthening   Cybex Leg Press 13 plates bilat LE x 20, 8 plates single leg x 20      Knee/Hip Exercises: Standing   Terminal Knee Extension Left;15 reps    Theraband Level (Terminal Knee Extension) Level 4 (Blue)    Forward Step Up 15 reps    Forward Step Up Limitations 12'' step holding blue med ball    Wall Squat Limitations  squat on bosu x 10 intermittent UE support    SLS SL deadlift 10# KB to 12'' step    Walking with Sports Cord sidestep green TB x 2 laps      Vasopneumatic   Number Minutes Vasopneumatic  10 minutes    Vasopnuematic Location  Knee    Vasopneumatic Pressure Medium    Vasopneumatic Temperature  34                          PT Long Term Goals - 04/24/21 3557       PT LONG TERM GOAL #1   Title Pt wil be independent with HEP    Time 6    Period Weeks    Status On-going      PT LONG TERM GOAL #2   Title Pt will improve FOTO to >= 71 to demo improved functional mobility    Time 6    Period Weeks    Status On-going  PT LONG TERM GOAL #3   Title Pt will improve Lt LE strength to 4+/5 to progress towards return to tae kwon do    Time 6    Period Weeks    Status On-going      PT LONG TERM GOAL #4   Title Pt will improve Lt knee flexion ROM to >= 120 to improve ability to negotiate stairs with decreased pain    Time 6    Period Weeks    Status Achieved                   Plan - 05/08/21 0837     Clinical Impression Statement Pt with difficulty with addition of bosu squats and single leg dead lift. Improving SLS stance control. He is progressing well towards goals    PT Next Visit Plan 6'' step downs, BFR?    PT Home Exercise Plan [email or web address]             Patient will benefit from skilled therapeutic intervention in order to improve the following deficits and impairments:     Visit Diagnosis: Acute pain of left knee  Muscle weakness (generalized)  Other abnormalities of gait and mobility  Localized edema     Problem List Patient Active Problem List   Diagnosis Date Noted   Tinea cruris 12/28/2018   Patellar dislocation 04/07/2013    Tymir Terral, PT 05/08/2021, 8:39 AM  [facility] [address] [address] [place], [place], [address] Phone: [phone]   Fax:   [phone]  Name: Abdalla Naramore MRN: [id number] Date of Birth: 25-Dec-1990

## 2021-05-11 ENCOUNTER — Other Ambulatory Visit: Payer: Self-pay

## 2021-05-11 ENCOUNTER — Ambulatory Visit (INDEPENDENT_AMBULATORY_CARE_PROVIDER_SITE_OTHER): Payer: BC Managed Care – PPO | Admitting: Physical Therapy

## 2021-05-11 DIAGNOSIS — M6281 Muscle weakness (generalized): Secondary | ICD-10-CM | POA: Diagnosis not present

## 2021-05-11 DIAGNOSIS — R2689 Other abnormalities of gait and mobility: Secondary | ICD-10-CM | POA: Diagnosis not present

## 2021-05-11 DIAGNOSIS — M25562 Pain in left knee: Secondary | ICD-10-CM

## 2021-05-11 DIAGNOSIS — R600 Localized edema: Secondary | ICD-10-CM

## 2021-05-11 NOTE — Patient Instructions (Signed)
Access Code: [email or web address] URL: [email or web address] Date: 05/11/2021 Prepared by: Reggy [employer]  Exercises Single-Leg [place] Deadlift With Kettlebell - 1 x daily - 7 x weekly - 2 sets - 10 reps Lateral Step Down - 1 x daily - 7 x weekly - 2 sets - 10 reps Runner's Step Up/Down - 1 x daily - 7 x weekly - 2 sets - 10 reps Side Stepping with Resistance at Ankles - 1 x daily - 7 x weekly - 3 sets - 10 reps

## 2021-05-11 NOTE — Therapy (Signed)
[facility] [place] [address] [place], [place], [address] Phone: [phone]   Fax:  [phone]  Physical Therapy Treatment  Patient Details  Name: Kree Rafter MRN: [id number] Date of Birth: 01-01-1991 Referring Provider (PT): Dalldorf   Encounter Date: 05/11/2021   PT End of Session - 05/11/21 0835     Visit Number 8    Number of Visits 12    Date for PT Re-Evaluation 05/25/21    PT Start Time 0800    PT Stop Time 0845    PT Time Calculation (min) 45 min    Activity Tolerance Patient tolerated treatment well    Behavior During Therapy [facility] for tasks assessed/performed             Past Medical History:  Diagnosis Date   No pertinent past medical history     Past Surgical History:  Procedure Laterality Date   TONSILLECTOMY     WISDOM TOOTH EXTRACTION      There were no vitals filed for this visit.   Subjective Assessment - 05/11/21 0802     Subjective Pt states MD says to wait 2 more months before beginning tae kwon do.    Patient Stated Goals improve gait, strength and mobility    Currently in Pain? No/denies                [facility] PT Assessment - 05/11/21 0001       Assessment   Medical Diagnosis Lt knee MPFL    Referring Provider (PT) Dalldorf    Onset Date/Surgical Date 03/15/21    Next MD Visit 05/09/21      Observation/Other Assessments   Focus on Therapeutic Outcomes (FOTO)  74      Strength   Overall Strength Comments hip strength 4+/5 for flexion and abduction                           [facility] Adult PT Treatment/Exercise - 05/11/21 0001       Knee/Hip Exercises: Aerobic   Recumbent Bike L5 x 5 min warm up      Knee/Hip Exercises: Machines for Strengthening   Cybex Leg Press 13 plates bilat LE x 20, 8 plates single leg x 20      Knee/Hip Exercises: Standing   Forward Step Up Limitations 12'' step holding blue med ball    Step Down Limitations lateral step down  eccentric 8'' step x 12    Wall Squat Limitations squat on bosu x 10 intermittent UE support    SLS SL deadlift 10# KB to 8'' step    Walking with Sports Cord sidestep green TB x 2 laps      Vasopneumatic   Number Minutes Vasopneumatic  10 minutes    Vasopnuematic Location  Knee    Vasopneumatic Pressure Medium    Vasopneumatic Temperature  34                     PT Education - 05/11/21 0835     Education Details updated HEP    Person(s) Educated Patient    Methods Explanation;Demonstration;Handout    Comprehension Returned demonstration;Verbalized understanding                 PT Long Term Goals - 05/11/21 0803       PT LONG TERM GOAL #1   Title Pt wil be independent with HEP    Status On-going  PT LONG TERM GOAL #2   Title Pt will improve FOTO to >= 71 to demo improved functional mobility    Baseline 74    Status Achieved      PT LONG TERM GOAL #3   Title Pt will improve Lt LE strength to 4+/5 to progress towards return to tae kwon do    Status Achieved      PT LONG TERM GOAL #4   Title Pt will improve Lt knee flexion ROM to >= 120 to improve ability to negotiate stairs with decreased pain    Status Achieved                   Plan - 05/11/21 0839     Clinical Impression Statement Pt improving tolerance to SL dead lift and eccentric step downs. still fatigues quickly requiring frequent rest breaks during sets. updated HEP    PT Next Visit Plan step downs, BFR    PT Home Exercise Plan [email or web address]             Patient will benefit from skilled therapeutic intervention in order to improve the following deficits and impairments:     Visit Diagnosis: Acute pain of left knee  Muscle weakness (generalized)  Other abnormalities of gait and mobility  Localized edema     Problem List Patient Active Problem List   Diagnosis Date Noted   Tinea cruris 12/28/2018   Patellar dislocation 04/07/2013    Mckinzi Eriksen,  PT 05/11/2021, 8:40 AM  [facility] [address] [place] [address] [place], [place], [address] Phone: [phone]   Fax:  [phone]  Name: Keveon Amsler MRN: [id number] Date of Birth: July 24, 1990

## 2021-05-15 ENCOUNTER — Ambulatory Visit (INDEPENDENT_AMBULATORY_CARE_PROVIDER_SITE_OTHER): Payer: BC Managed Care – PPO | Admitting: Physical Therapy

## 2021-05-15 ENCOUNTER — Other Ambulatory Visit: Payer: Self-pay

## 2021-05-15 DIAGNOSIS — M25562 Pain in left knee: Secondary | ICD-10-CM | POA: Diagnosis not present

## 2021-05-15 DIAGNOSIS — M6281 Muscle weakness (generalized): Secondary | ICD-10-CM | POA: Diagnosis not present

## 2021-05-15 DIAGNOSIS — R2689 Other abnormalities of gait and mobility: Secondary | ICD-10-CM | POA: Diagnosis not present

## 2021-05-15 DIAGNOSIS — R600 Localized edema: Secondary | ICD-10-CM

## 2021-05-15 NOTE — Therapy (Addendum)
[place] [facility] °[address] °, [place], [address] °Phone: [phone]   Fax:  [phone] ° °Physical Therapy Treatment and Discharge ° °Patient Details  °Name: Roger Fox °MRN: [id number] °Date of Birth: 11/28/1990 °Referring Provider (PT): Dalldorf ° ° °Encounter Date: 05/15/2021 ° ° PT End of Session - 05/15/21 0806   ° ° Visit Number 9   ° Number of Visits 12   ° Date for PT Re-Evaluation 05/25/21   ° PT Start Time 0805   ° PT Stop Time 0850   ° PT Time Calculation (min) 45 min   ° Activity Tolerance Patient tolerated treatment well   ° Behavior During Therapy [facility] for tasks assessed/performed   ° °  °  ° °  ° ° °Past Medical History:  °Diagnosis Date  ° No pertinent past medical history   ° ° °Past Surgical History:  °Procedure Laterality Date  ° TONSILLECTOMY    ° WISDOM TOOTH EXTRACTION    ° ° °There were no vitals filed for this visit. ° ° Subjective Assessment - 05/15/21 0807   ° ° Subjective Pt reports his Lt knee feels stiff this morning.  He reports his Lt foot ends up hurting when doing SLS exercises.  He is still restricted from running, jumping, cutting until next MD appt.   ° Pertinent History frequent Lt patella dislocations   ° Currently in Pain? No/denies   ° Pain Score 0-No pain   ° Pain Location Knee   ° °  °  ° °  ° ° ° ° ° [facility] PT Assessment - 05/15/21 0001   ° °  ° Assessment  ° Medical Diagnosis Lt knee MPFL   ° Referring Provider (PT) Dalldorf   ° Onset Date/Surgical Date 03/15/21   ° Next MD Visit Feb 2023   ° °  °  ° °  ° ° ° [facility] Adult PT Treatment/Exercise - 05/15/21 0001   ° °  ° Knee/Hip Exercises: Stretches  ° Passive Hamstring Stretch Left;2 reps;20 seconds;Right;1 rep   ° Gastroc Stretch Limitations plantar fascia/gastroc/ soleus stretch x 20 sec LLE, 4 reps  (1 rep after each SLS exercise)   °  ° Knee/Hip Exercises: Aerobic  ° Recumbent Bike L4 x 5.5 min warm up   °  ° Knee/Hip Exercises: Standing  ° SLS Lt single leg  squats to touch buttocks to elevated mat x 10; Lt curtsy squats (with Rt foot sliding on towel) x 10.  Rt single leg stance with Lt resisted clam (Lt foot on wall). x 15 reps, R/L single leg heel raises with UE support (2-3 sec up/down)  ° Walking with Sports Cord sidestep green TB x 2 laps   °  ° Knee/Hip Exercises: Supine  ° Bridges Strengthening;Both   8 reps with hamstring curls, feet on green pball.  ° °  °  ° °  ° °Vaso, med pressure, 34°, x 10 min. For edema.  ° ° ° PT Long Term Goals - 05/11/21 0803   ° °  ° PT LONG TERM GOAL #1  ° Title Pt wil be independent with HEP   ° Status On-going   °  ° PT LONG TERM GOAL #2  ° Title Pt will improve FOTO to >= 71 to demo improved functional mobility   ° Baseline 74   ° Status Achieved   °  ° PT LONG TERM GOAL #3  ° Title Pt will improve Lt LE strength to   4+/5 to progress towards return to tae kwon do   ° Status Achieved   °  ° PT LONG TERM GOAL #4  ° Title Pt will improve Lt knee flexion ROM to >= 120 to improve ability to negotiate stairs with decreased pain   ° Status Achieved   ° °  °  ° °  ° ° ° ° ° ° ° ° Plan - 05/15/21 0831   ° ° Clinical Impression Statement Continued discomfort in Lt toe flexors with SLS exercises.  Tolerated exercises well, with a good challenge with hamstring curls (feet on ball) and single leg heel raises.  Verbally added single leg heel raises on Lt to HEP.  Progressing well towards remaining goals.   ° PT Frequency 2x / week   ° PT Duration 6 weeks   ° PT Treatment/Interventions Aquatic Therapy;Cryotherapy;Moist Heat;Iontophoresis 4mg/ml Dexamethasone;Electrical Stimulation;Gait training;Stair training;Neuromuscular re-education;Balance training;Therapeutic exercise;Therapeutic activities;Patient/family education;Manual techniques;Taping;Dry needling;Passive range of motion;Vasopneumatic Device   ° PT Next Visit Plan single leg strengthening for LLE kinetic chain.   ° PT Home Exercise Plan [email or web address]   ° Consulted and Agree with Plan of  Care Patient   ° °  °  ° °  ° ° °Patient will benefit from skilled therapeutic intervention in order to improve the following deficits and impairments:  Pain, Decreased strength, Decreased activity tolerance, Decreased range of motion, Increased edema, Difficulty walking, Decreased balance, Abnormal gait ° °Visit Diagnosis: °Acute pain of left knee ° °Muscle weakness (generalized) ° °Other abnormalities of gait and mobility ° °Localized edema ° ° ° ° °Problem List °Patient Active Problem List  ° Diagnosis Date Noted  ° Tinea cruris 12/28/2018  ° Patellar dislocation 04/07/2013  °PHYSICAL THERAPY DISCHARGE SUMMARY ° °Visits from Start of Care: 9 ° °Current functional level related to goals / functional outcomes: °Improved strength and ROM °  °Remaining deficits: °Lt foot pain °  °Education / Equipment: °HEP  ° °Patient agrees to discharge. Patient goals were partially met. Patient is being discharged due to being pleased with the current functional level. ° °Karen Donawerth, PT,DPT01/02/2309:43 AM ° ° °Jennifer Carlson-Long, PTA °05/15/21 8:47 AM ° ° ° °[facility] °[address] °[place], [place], [address] °Phone: [phone]   Fax:  [phone] ° °Name: Roger Fox °MRN: [id number] °Date of Birth: 07/06/1990 ° ° ° °

## 2021-05-18 ENCOUNTER — Encounter: Payer: BC Managed Care – PPO | Admitting: Physical Therapy

## 2021-05-18 ENCOUNTER — Other Ambulatory Visit: Payer: Self-pay

## 2021-05-22 ENCOUNTER — Encounter: Payer: BC Managed Care – PPO | Admitting: Physical Therapy

## 2021-05-25 ENCOUNTER — Encounter: Payer: BC Managed Care – PPO | Admitting: Physical Therapy

## 2022-09-09 ENCOUNTER — Encounter: Payer: Self-pay | Admitting: *Deleted

## 2022-09-10 IMAGING — DX DG KNEE COMPLETE 4+V*L*
4 series · 4 of 4 positions shown · non-contrast
Comparison: 03/22/2013

CLINICAL DATA: Left knee pain following a dislocation injury today.
Previous patellar dislocation.

EXAM:
LEFT KNEE - COMPLETE 4+ VIEW

[knee ap]
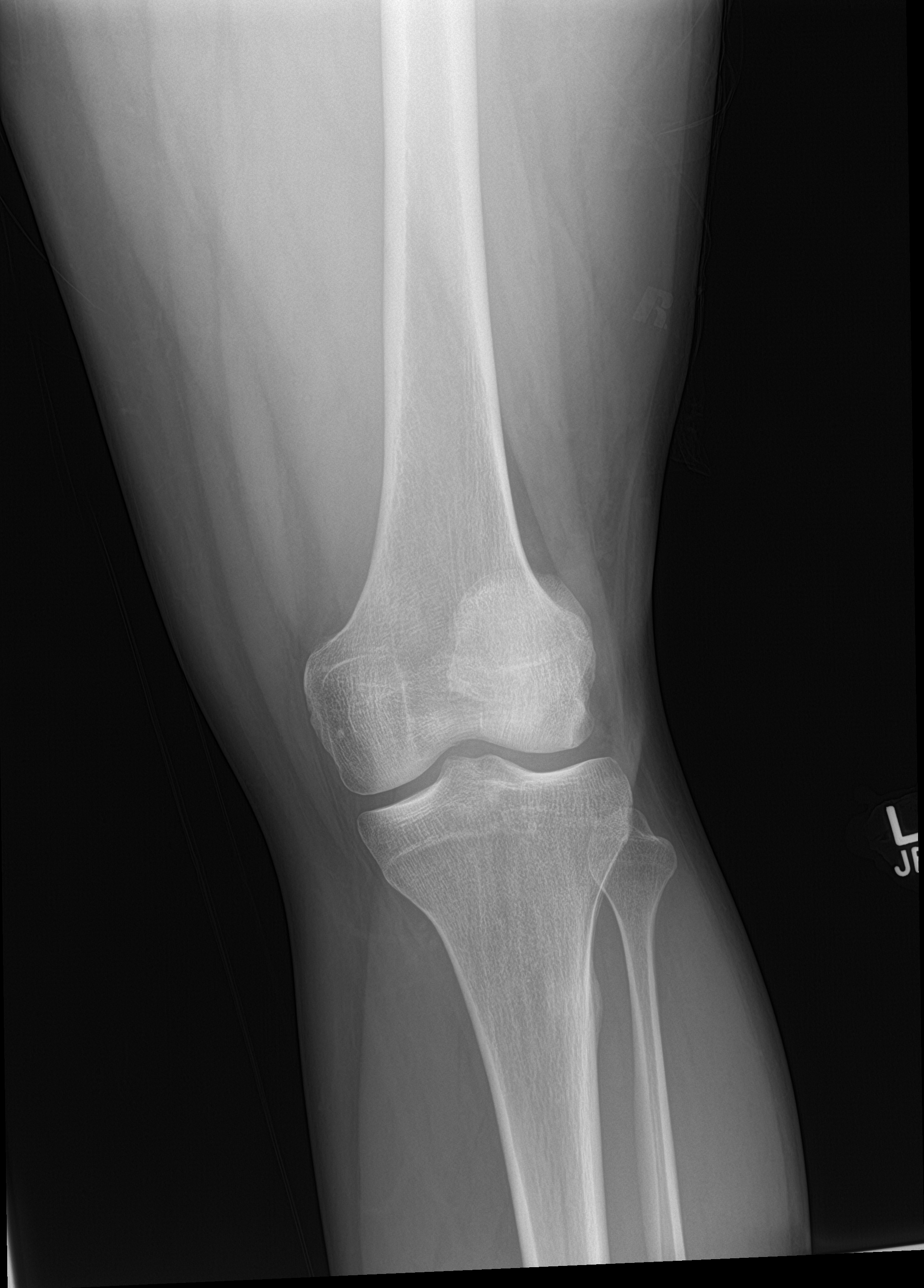

[knee lat]
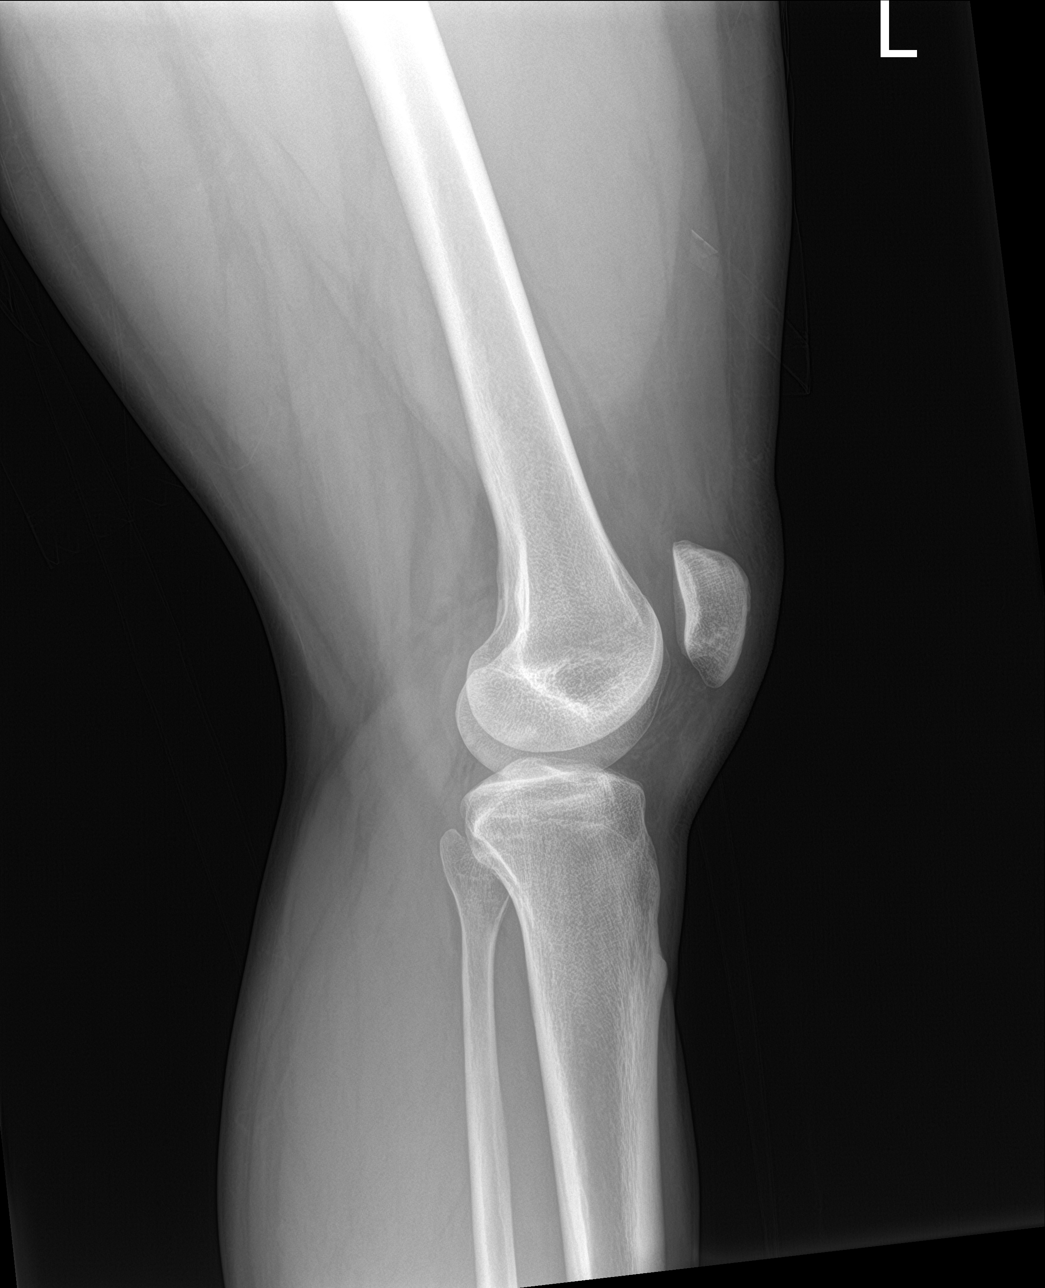

[knee obl (1 of 2)]
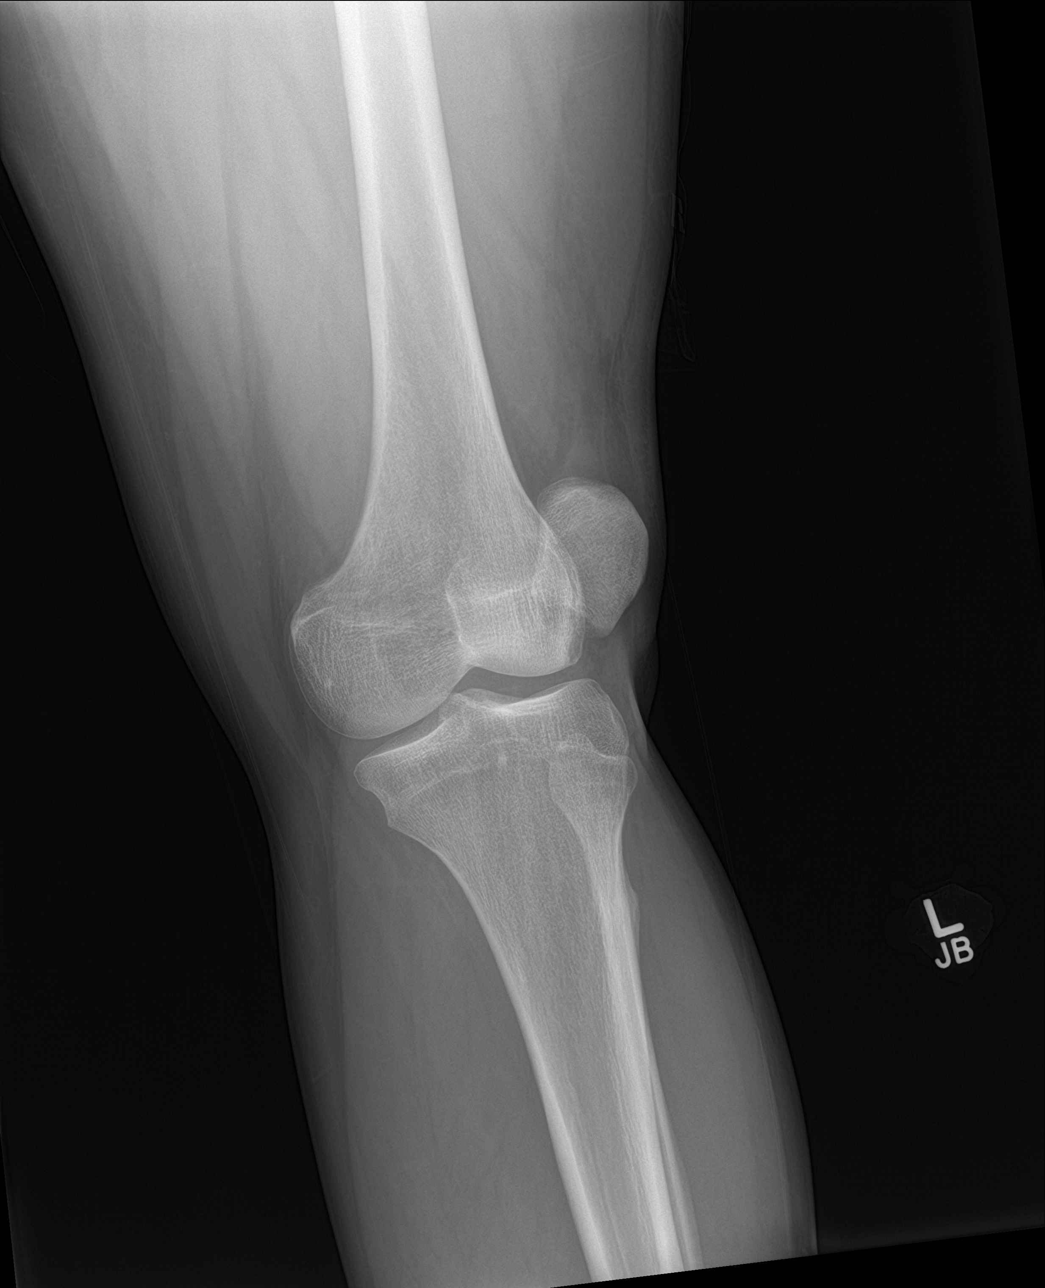

[knee obl (2 of 2)]
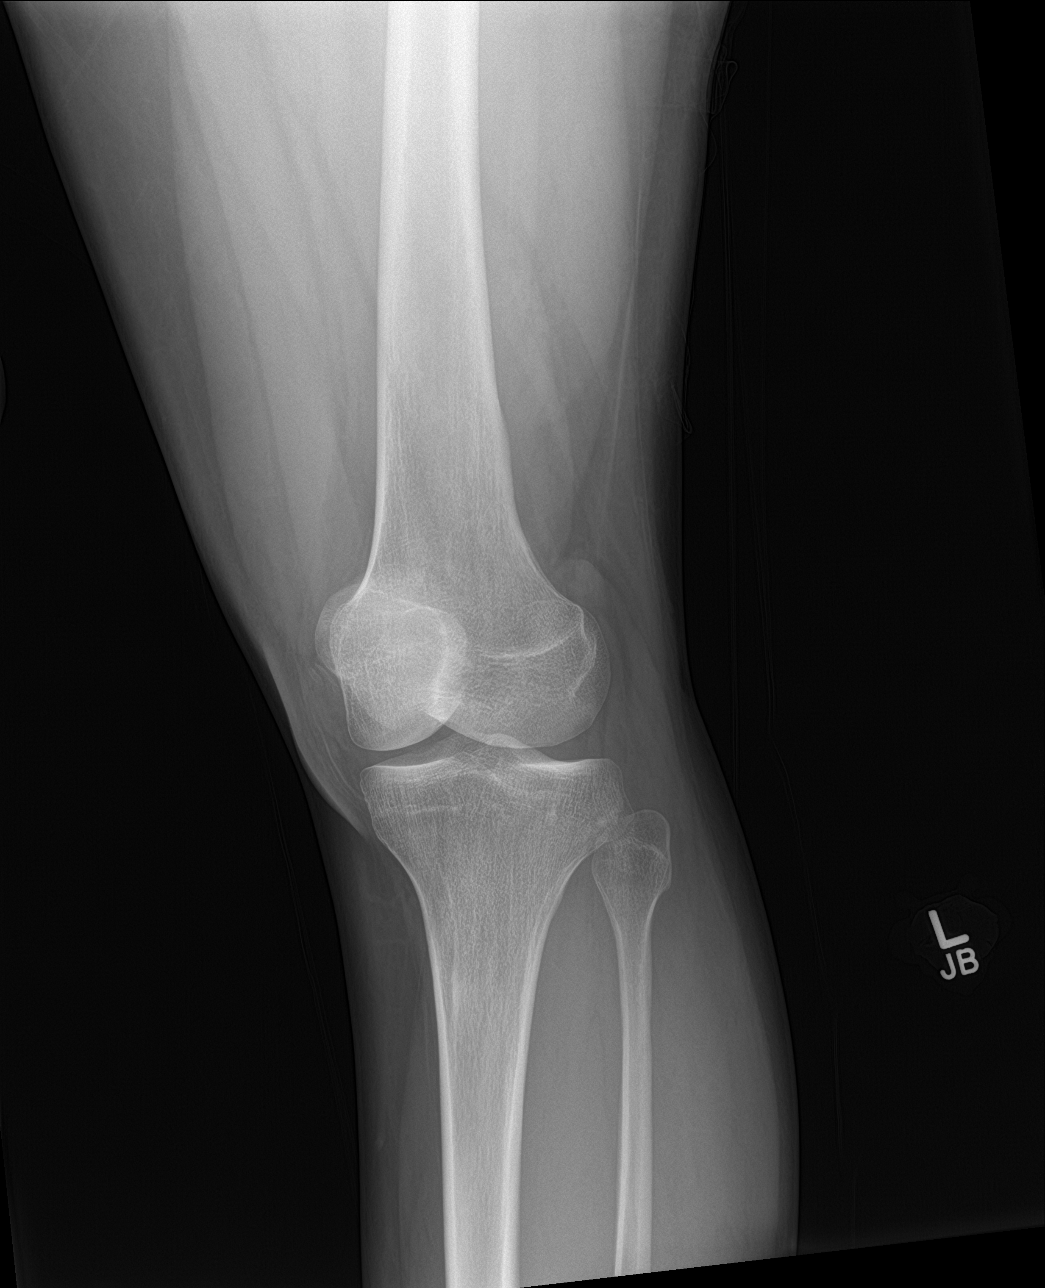

[4 of 4 positions shown; findings below may reference images not displayed]

FINDINGS: No evidence of fracture, dislocation, or joint effusion. No evidence
of arthropathy or other focal bone abnormality. Soft tissues are
unremarkable.
IMPRESSION: Normal examination.
